# Patient Record
Sex: Female | Born: 1950 | Race: White | Hispanic: No | Smoking: Never smoker
Health system: Southern US, Community
[De-identification: ages and names within clinical notes are randomized; demographics above are authoritative.]

## PROBLEM LIST (undated history)

## (undated) DIAGNOSIS — M722 Plantar fascial fibromatosis: Secondary | ICD-10-CM

## (undated) DIAGNOSIS — M542 Cervicalgia: Secondary | ICD-10-CM

## (undated) DIAGNOSIS — E8801 Alpha-1-antitrypsin deficiency: Secondary | ICD-10-CM

## (undated) DIAGNOSIS — M26609 Unspecified temporomandibular joint disorder, unspecified side: Secondary | ICD-10-CM

## (undated) DIAGNOSIS — I10 Essential (primary) hypertension: Secondary | ICD-10-CM

## (undated) DIAGNOSIS — G8929 Other chronic pain: Secondary | ICD-10-CM

## (undated) DIAGNOSIS — R42 Dizziness and giddiness: Secondary | ICD-10-CM

## (undated) DIAGNOSIS — G4700 Insomnia, unspecified: Secondary | ICD-10-CM

## (undated) DIAGNOSIS — M1990 Unspecified osteoarthritis, unspecified site: Secondary | ICD-10-CM

## (undated) DIAGNOSIS — H35039 Hypertensive retinopathy, unspecified eye: Secondary | ICD-10-CM

## (undated) HISTORY — PX: KIDNEY STONE SURGERY: SHX686

## (undated) HISTORY — DX: Alpha-1-antitrypsin deficiency: E88.01

## (undated) HISTORY — DX: Other chronic pain: G89.29

## (undated) HISTORY — DX: Plantar fascial fibromatosis: M72.2

## (undated) HISTORY — DX: Insomnia, unspecified: G47.00

## (undated) HISTORY — PX: ABDOMINAL HYSTERECTOMY: SHX81

## (undated) HISTORY — PX: ADENOIDECTOMY: SUR15

## (undated) HISTORY — PX: TONSILLECTOMY: SUR1361

## (undated) HISTORY — DX: Cervicalgia: M54.2

## (undated) HISTORY — PX: OTHER SURGICAL HISTORY: SHX169

## (undated) HISTORY — DX: Unspecified osteoarthritis, unspecified site: M19.90

## (undated) HISTORY — DX: Hypertensive retinopathy, unspecified eye: H35.039

## (undated) HISTORY — PX: PARTIAL HYSTERECTOMY: SHX80

## (undated) HISTORY — DX: Essential (primary) hypertension: I10

## (undated) HISTORY — DX: Unspecified temporomandibular joint disorder, unspecified side: M26.609

## (undated) HISTORY — PX: BACK SURGERY: SHX140

---

## 1988-09-29 HISTORY — PX: ABDOMINAL HYSTERECTOMY: SHX81

## 1998-07-05 ENCOUNTER — Other Ambulatory Visit: Admission: RE | Admit: 1998-07-05 | Discharge: 1998-07-05 | Payer: Self-pay | Admitting: Obstetrics and Gynecology

## 2002-03-02 ENCOUNTER — Emergency Department (HOSPITAL_COMMUNITY): Admission: EM | Admit: 2002-03-02 | Discharge: 2002-03-02 | Payer: Self-pay | Admitting: Emergency Medicine

## 2002-03-02 ENCOUNTER — Encounter: Payer: Self-pay | Admitting: Emergency Medicine

## 2008-09-29 DIAGNOSIS — N200 Calculus of kidney: Secondary | ICD-10-CM

## 2008-09-29 HISTORY — DX: Calculus of kidney: N20.0

## 2009-06-20 ENCOUNTER — Emergency Department (HOSPITAL_COMMUNITY): Admission: EM | Admit: 2009-06-20 | Discharge: 2009-06-21 | Payer: Self-pay | Admitting: Emergency Medicine

## 2009-07-09 ENCOUNTER — Ambulatory Visit (HOSPITAL_BASED_OUTPATIENT_CLINIC_OR_DEPARTMENT_OTHER): Admission: RE | Admit: 2009-07-09 | Discharge: 2009-07-09 | Payer: Self-pay | Admitting: Urology

## 2009-08-03 ENCOUNTER — Encounter: Admission: RE | Admit: 2009-08-03 | Discharge: 2009-08-03 | Payer: Self-pay | Admitting: Endocrinology

## 2009-11-23 IMAGING — US US SOFT TISSUE HEAD/NECK
1 series · 14 of 25 positions shown · non-contrast
Comparison: None

CLINICAL DATA: Enlarged thyroid on physical exam, some hair loss,
normal thyroid laboratory values

THYROID ULTRASOUND
TECHNIQUE: Ultrasound examination of the thyroid gland and
adjacent soft tissues was performed.

[Series 1: us soft tissue head/neck · 0.07mm/px · 14 of 42 slices shown]
[im 1/42]
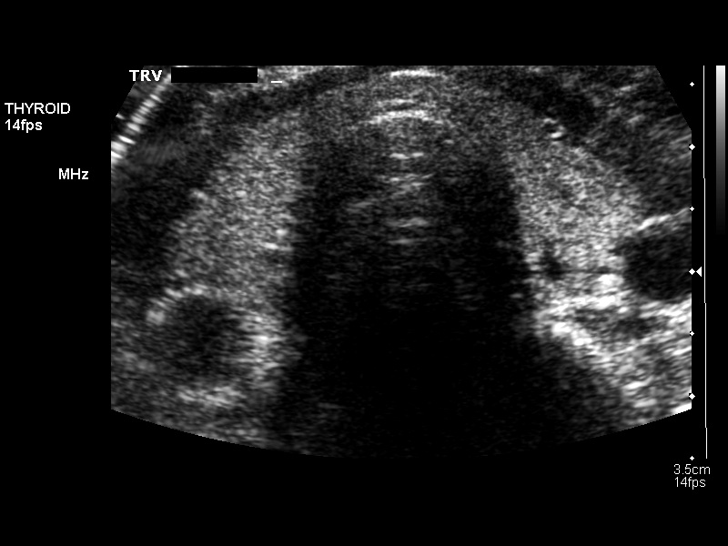
[im 4/42]
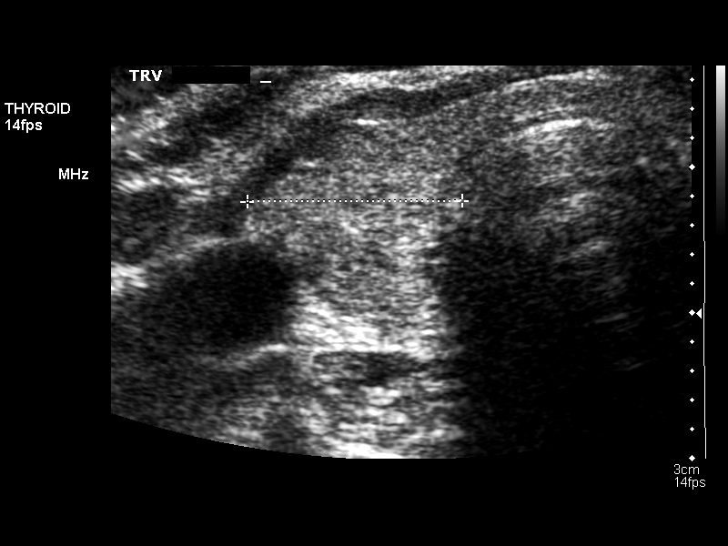
[im 7/42]
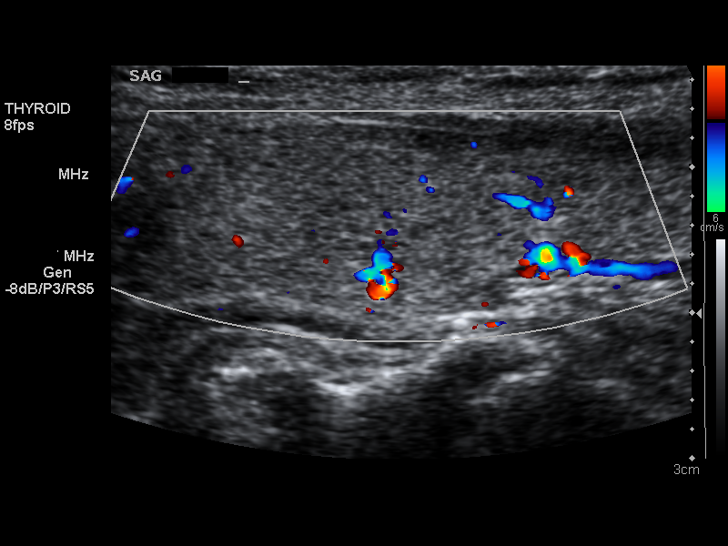
[im 11/42]
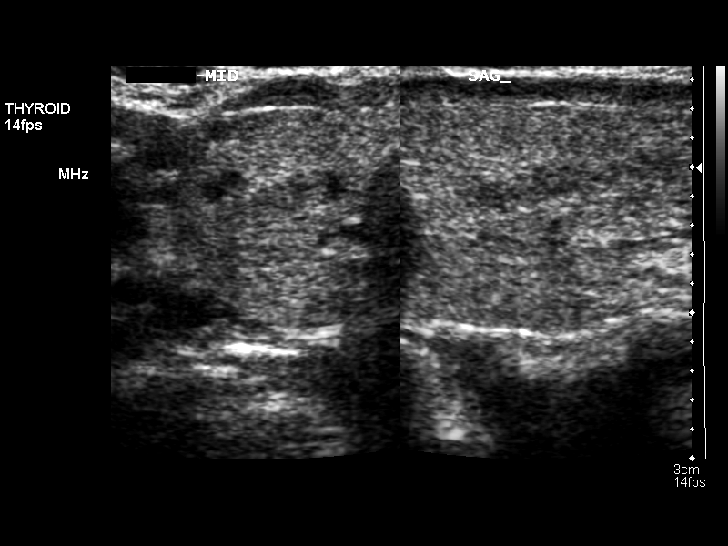
[im 14/42]
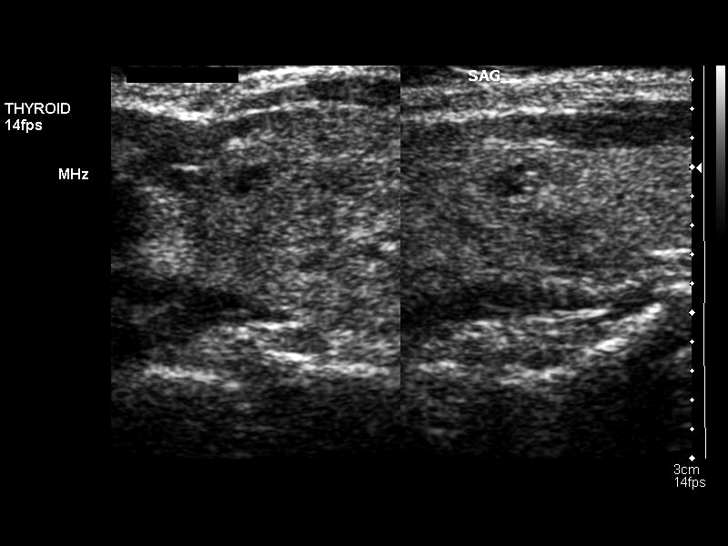
[im 16/42]
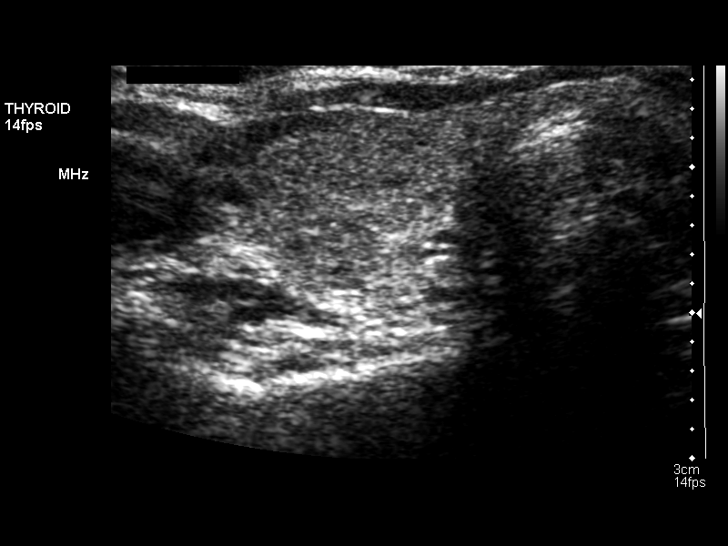
[im 19/42]
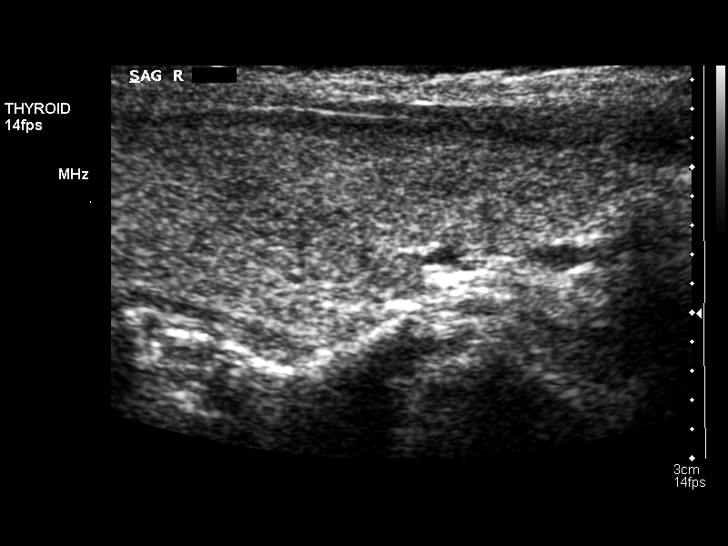
[im 23/42]
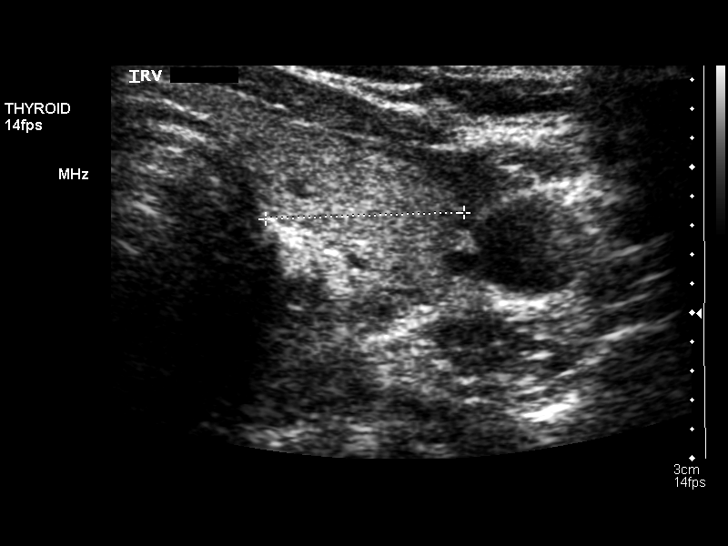
[im 26/42]
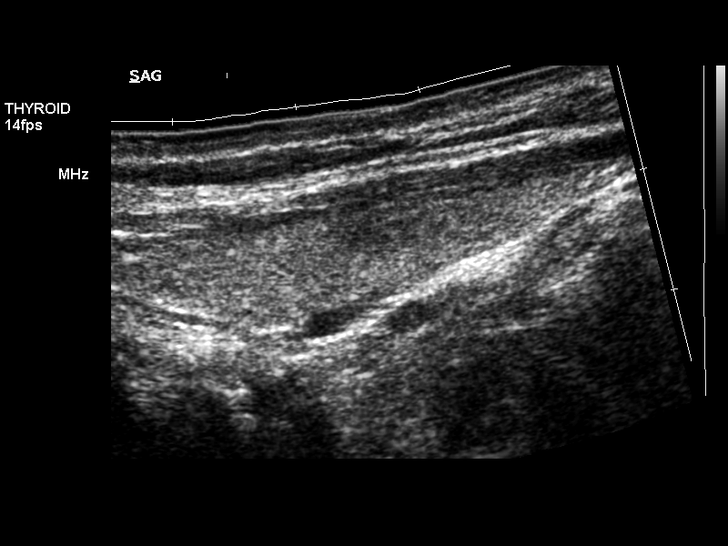
[im 28/42]
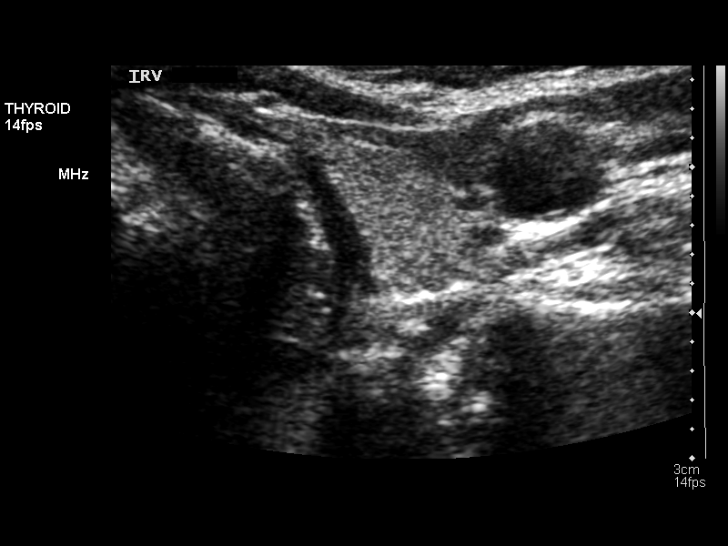
[im 31/42]
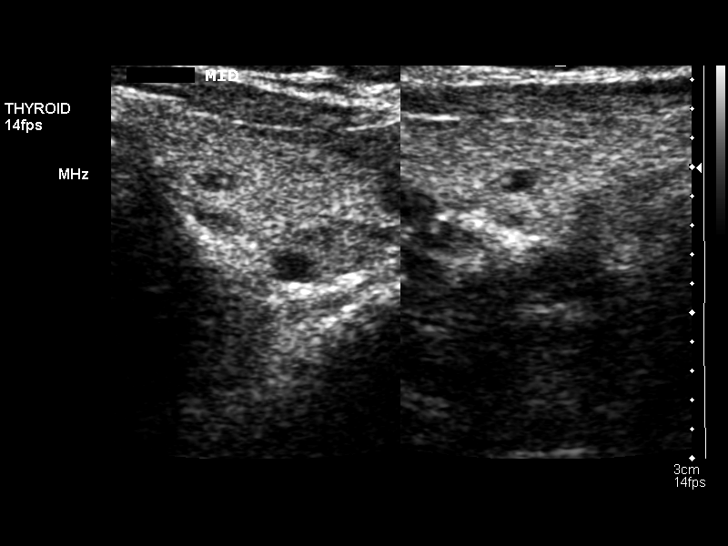
[im 35/42]
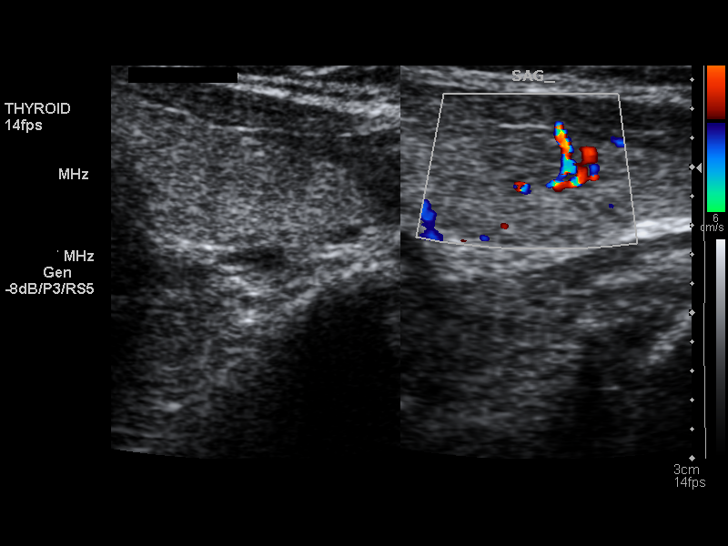
[im 38/42]
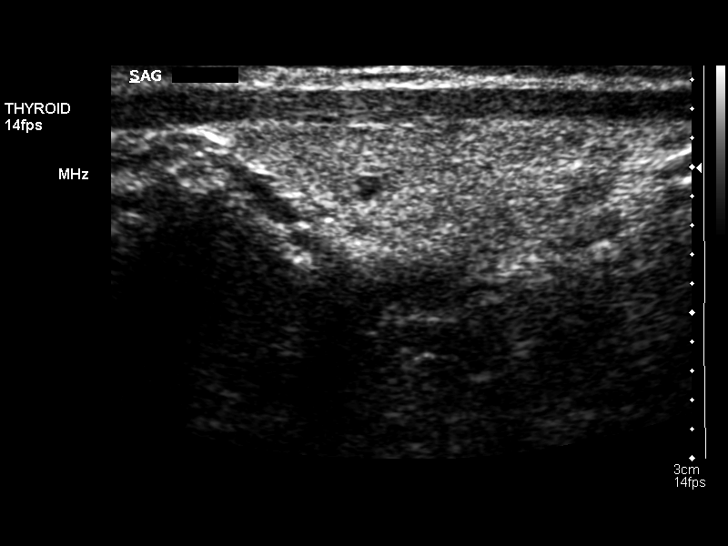
[im 42/42]
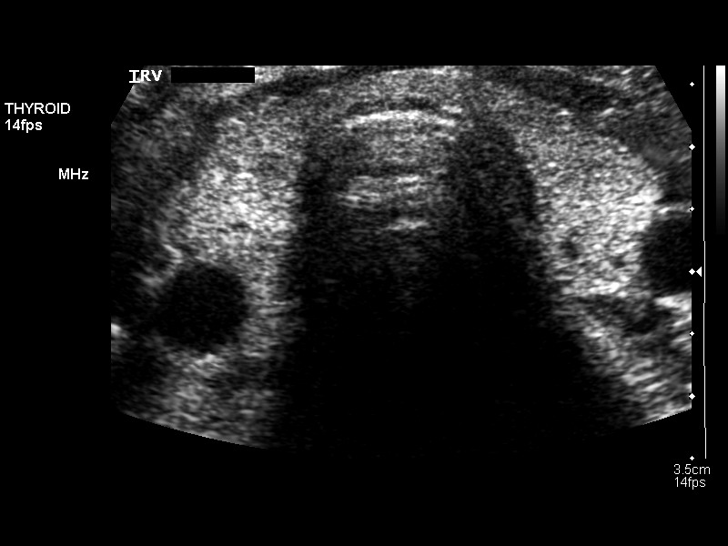

[14 of 25 positions shown; findings below may reference images not displayed]

FINDINGS: The thyroid gland is normal in size.  The right lobe
measures 4.3 x 0.9 x 1.5 cm.  The left lobe measures 4.0 x 1.0 x
1.4 cm, with the isthmus measuring 2.6 mm.  The gland is relatively
homogeneous.

There is a solid nodule in the right mid lobe which is somewhat
indistinct measuring 0.9 x 0.3 x 1.0 cm.  A hypoechoic nodule on
the right measures 4 x 2 x 5 mm.

On the left there is an indistinct solid nodule in the left mid
lobe of 0.8 x 0.5 x 0.8 cm with a hypoechoic nodule of 3 x 2 x 4 mm
in mid left lobe.
IMPRESSION: The thyroid gland is normal in size with only small nodules
bilaterally.

## 2010-01-30 ENCOUNTER — Encounter: Admission: RE | Admit: 2010-01-30 | Discharge: 2010-01-30 | Payer: Self-pay | Admitting: Endocrinology

## 2011-01-02 LAB — POCT HEMOGLOBIN-HEMACUE: Hemoglobin: 13.7 g/dL (ref 12.0–15.0)

## 2011-01-03 LAB — COMPREHENSIVE METABOLIC PANEL
ALT: 21 U/L (ref 0–35)
Alkaline Phosphatase: 75 U/L (ref 39–117)
CO2: 26 mEq/L (ref 19–32)
Chloride: 104 mEq/L (ref 96–112)
GFR calc non Af Amer: >60 mL/min (ref >60)
Glucose, Bld: 120 mg/dL — ABNORMAL HIGH (ref 70–99)
Potassium: 3.6 mEq/L (ref 3.5–5.1)
Sodium: 139 mEq/L (ref 135–145)
Total Bilirubin: 0.7 mg/dL (ref 0.3–1.2)

## 2011-01-03 LAB — URINE CULTURE: Colony Count: NO GROWTH

## 2011-01-03 LAB — URINALYSIS, ROUTINE W REFLEX MICROSCOPIC
Ketones, ur: NEGATIVE mg/dL
Nitrite: NEGATIVE
Specific Gravity, Urine: 1.029 (ref 1.005–1.030)
Urobilinogen, UA: 0.2 mg/dL (ref 0.0–1.0)
pH: 5 (ref 5.0–8.0)

## 2011-01-03 LAB — URINE MICROSCOPIC-ADD ON

## 2011-01-03 LAB — CBC: MCHC: 34 g/dL (ref 30.0–36.0)

## 2011-01-03 LAB — DIFFERENTIAL
Basophils Relative: 0 % (ref 0–1)
Eosinophils Absolute: 0 10*3/uL (ref 0.0–0.7)
Eosinophils Relative: 0 % (ref 0–5)
Neutrophils Relative %: 85 % — ABNORMAL HIGH (ref 43–77)

## 2011-02-24 ENCOUNTER — Emergency Department (HOSPITAL_COMMUNITY)
Admission: EM | Admit: 2011-02-24 | Discharge: 2011-02-24 | Disposition: A | Discharge status: Home / Self Care | Payer: BC Managed Care – PPO | Attending: Emergency Medicine | Admitting: Emergency Medicine

## 2011-02-24 DIAGNOSIS — M79609 Pain in unspecified limb: Secondary | ICD-10-CM | POA: Insufficient documentation

## 2011-02-24 DIAGNOSIS — N390 Urinary tract infection, site not specified: Secondary | ICD-10-CM | POA: Insufficient documentation

## 2011-02-24 DIAGNOSIS — R6883 Chills (without fever): Secondary | ICD-10-CM | POA: Insufficient documentation

## 2011-02-24 LAB — URINALYSIS, ROUTINE W REFLEX MICROSCOPIC
Nitrite: NEGATIVE
Protein, ur: NEGATIVE mg/dL
Specific Gravity, Urine: 1.011 (ref 1.005–1.030)
Urobilinogen, UA: 0.2 mg/dL (ref 0.0–1.0)

## 2011-02-24 LAB — CBC
Hemoglobin: 13.4 g/dL (ref 12.0–15.0)
MCHC: 33.8 g/dL (ref 30.0–36.0)
Platelets: 269 10*3/uL (ref 150–400)
RBC: 4.45 MIL/uL (ref 3.87–5.11)

## 2011-02-24 LAB — URINE MICROSCOPIC-ADD ON

## 2011-02-24 LAB — DIFFERENTIAL
Basophils Absolute: 0 10*3/uL (ref 0.0–0.1)
Basophils Relative: 0 % (ref 0–1)
Eosinophils Absolute: 0.1 10*3/uL (ref 0.0–0.7)
Neutro Abs: 5.6 10*3/uL (ref 1.7–7.7)
Neutrophils Relative %: 69 % (ref 43–77)

## 2011-02-24 LAB — BASIC METABOLIC PANEL
CO2: 27 mEq/L (ref 19–32)
Calcium: 9.7 mg/dL (ref 8.4–10.5)
GFR calc Af Amer: >60 mL/min (ref >60)
Sodium: 138 mEq/L (ref 135–145)

## 2011-02-25 LAB — URINE CULTURE
Colony Count: NO GROWTH
Culture  Setup Time: 201205271936
Culture: NO GROWTH

## 2011-04-30 ENCOUNTER — Encounter: Payer: Self-pay | Admitting: Podiatry

## 2011-04-30 DIAGNOSIS — M205X9 Other deformities of toe(s) (acquired), unspecified foot: Secondary | ICD-10-CM | POA: Insufficient documentation

## 2011-11-12 LAB — HM PAP SMEAR

## 2013-02-09 ENCOUNTER — Encounter: Payer: Self-pay | Admitting: *Deleted

## 2013-02-11 ENCOUNTER — Encounter: Payer: Self-pay | Admitting: Family Medicine

## 2013-02-11 ENCOUNTER — Ambulatory Visit (INDEPENDENT_AMBULATORY_CARE_PROVIDER_SITE_OTHER): Payer: BC Managed Care – PPO | Admitting: Family Medicine

## 2013-02-11 VITALS — BP 148/82 | HR 80 | Ht 63.0 in | Wt 154.4 lb

## 2013-02-11 DIAGNOSIS — Z79899 Other long term (current) drug therapy: Secondary | ICD-10-CM

## 2013-02-11 DIAGNOSIS — G4700 Insomnia, unspecified: Secondary | ICD-10-CM

## 2013-02-11 DIAGNOSIS — Z0000 Encounter for general adult medical examination without abnormal findings: Secondary | ICD-10-CM

## 2013-02-11 NOTE — Progress Notes (Signed)
  Subjective:    Patient ID: Brianna Carpenter, female    DOB: 15-Jul-1951, 62 y.o.   MRN: [id number]  HPI  Trouble sleeping at night. Usually requires the generic Ambien. On occasion patient feels the trade name Ambien does better. She likes to use both depending on how sleepy she is  diet very good. Exercising regularly. Very little fast food. Review of Systems ROS otherwise negative    Objective:   Physical Exam Alert no acute distress. Lungs clear. Heart regular rate and rhythm. HEENT normal. followup blood pressure normal 126/82       Assessment & Plan:  Impression insomnia discussed at length. Plan patient declines preventive physical, colonoscopy etc. Increase to blood work. WSL

## 2013-02-16 ENCOUNTER — Other Ambulatory Visit: Payer: Self-pay | Admitting: Nurse Practitioner

## 2013-02-16 ENCOUNTER — Telehealth: Payer: Self-pay | Admitting: Family Medicine

## 2013-02-16 NOTE — Telephone Encounter (Signed)
Patient would like to know if she needs to fast with her BW and if she can get an RX of the estrace cream to [facility] in [place]

## 2013-02-16 NOTE — Telephone Encounter (Signed)
Patient notified estrace refills was called in to [facility]. Also notified her she needs to fast for blood work.

## 2013-02-16 NOTE — Telephone Encounter (Signed)
Refill on estrace cream  

## 2013-02-16 NOTE — Telephone Encounter (Signed)
rf estrace times 4, fasting labs are choles and glucose depends on what was ordered

## 2013-02-24 LAB — HEPATIC FUNCTION PANEL
ALT: 17 U/L (ref 0–35)
AST: 15 U/L (ref 0–37)
Alkaline Phosphatase: 69 U/L (ref 39–117)
Bilirubin, Direct: 0.1 mg/dL (ref 0.0–0.3)
Indirect Bilirubin: 0.7 mg/dL (ref 0.0–0.9)
Total Protein: 6.5 g/dL (ref 6.0–8.3)

## 2013-02-24 LAB — BASIC METABOLIC PANEL
BUN: 19 mg/dL (ref 6–23)
Creat: 0.7 mg/dL (ref 0.50–1.10)
Glucose, Bld: 91 mg/dL (ref 70–99)
Potassium: 4.2 mEq/L (ref 3.5–5.3)

## 2013-02-24 LAB — LIPID PANEL
Cholesterol: 242 mg/dL — ABNORMAL HIGH (ref 0–200)
Triglycerides: 110 mg/dL (ref <150)

## 2013-08-26 ENCOUNTER — Ambulatory Visit: Payer: BC Managed Care – PPO | Admitting: Family Medicine

## 2013-08-26 VITALS — BP 135/82 | HR 85 | Temp 98.9°F | Resp 17 | Ht 63.5 in | Wt 153.0 lb

## 2013-08-26 DIAGNOSIS — R42 Dizziness and giddiness: Secondary | ICD-10-CM

## 2013-08-26 DIAGNOSIS — I10 Essential (primary) hypertension: Secondary | ICD-10-CM

## 2013-08-26 LAB — POCT CBC
HCT, POC: 43 % (ref 37.7–47.9)
Hemoglobin: 13.5 g/dL (ref 12.2–16.2)
Lymph, poc: 1.8 (ref 0.6–3.4)
MCH, POC: 30.2 pg (ref 27–31.2)
MCHC: 31.4 g/dL — AB (ref 31.8–35.4)
MPV: 9.6 fL (ref 0–99.8)
POC MID %: 4.2 %M (ref 0–12)
RBC: 4.47 M/uL (ref 4.04–5.48)
WBC: 5.7 10*3/uL (ref 4.6–10.2)

## 2013-08-26 LAB — COMPREHENSIVE METABOLIC PANEL
ALT: 14 U/L (ref 0–35)
AST: 17 U/L (ref 0–37)
Albumin: 4.6 g/dL (ref 3.5–5.2)
BUN: 13 mg/dL (ref 6–23)
CO2: 29 mEq/L (ref 19–32)
Calcium: 9.9 mg/dL (ref 8.4–10.5)
Chloride: 104 mEq/L (ref 96–112)
Potassium: 4.1 mEq/L (ref 3.5–5.3)

## 2013-08-26 LAB — TSH: TSH: 1.672 u[IU]/mL (ref 0.350–4.500)

## 2013-08-26 LAB — GLUCOSE, POCT (MANUAL RESULT ENTRY): POC Glucose: 87 mg/dl (ref 70–99)

## 2013-08-26 NOTE — Patient Instructions (Signed)
Minimize salt intake  Continue with regular exercise  Try and avoid any weight gain  Return if symptoms are worse  Continue monitor your blood pressure

## 2013-08-26 NOTE — Progress Notes (Signed)
Subjective: 62 year old lady with a history of her blood pressure creeping up on her more recently. Usually it is good, and she is comfortable with it being in the 130s or so. However she has had some readings recently that seemed to be going up more into the 140s or 150 region. She has noted a little tingly this at times. At nighttime when she is resting quietly she does wear earplugs, but recently has had more of a pounding sensation. Last night it seemed worse. She has a history of sleep disturbance for long time, and takes a tiny piece of anemia and at bedtime. She does get regular exercise about 3 days a week. She does not smoke. She is generally been a healthy person. She wonders about things like her blood sugar going low or her B12 being out of line  Objective: Pleasant lady in no major distress this time. I took the blood pressure again and got 154/84. Her TMs are normal. Eyes PERRLA. Neck supple without thyromegaly. Her chest is clear to auscultation. Heart regular without any murmurs gallops or arrhythmias. Neurologically she appears intact. Romberg is negative. No palmar drift.  Assessment: Systolic blood pressure elevation Nonspecific palpitations Mild dizziness Sleep disturbance  Plan: First check a few basic labs on her but I think she is quite healthy.  Results for orders placed in visit on 08/26/13  POCT CBC      Result Value Range   WBC 5.7  4.6 - 10.2 K/uL   Lymph, poc 1.8  0.6 - 3.4   POC LYMPH PERCENT 31.0  10 - 50 %L   MID (cbc) 0.2  0 - 0.9   POC MID % 4.2  0 - 12 %M   POC Granulocyte 3.7  2 - 6.9   Granulocyte percent 64.8  37 - 80 %G   RBC 4.47  4.04 - 5.48 M/uL   Hemoglobin 13.5  12.2 - 16.2 g/dL   HCT, POC 40.1  02.7 - 47.9 %   MCV 96.3  80 - 97 fL   MCH, POC 30.2  27 - 31.2 pg   MCHC 31.4 (*) 31.8 - 35.4 g/dL   RDW, POC 25.3     Platelet Count, POC 285  142 - 424 K/uL   MPV 9.6  0 - 99.8 fL  GLUCOSE, POCT (MANUAL RESULT ENTRY)      Result Value Range   POC Glucose 87  70 - 99 mg/dl

## 2013-10-14 ENCOUNTER — Telehealth: Payer: Self-pay | Admitting: Family Medicine

## 2013-10-14 NOTE — Telephone Encounter (Signed)
Pt notified she needs to come into office for blood pressure check. Last seen in May. Pt made an appt.

## 2013-10-14 NOTE — Telephone Encounter (Signed)
Patient says that the top number of her blood pressure has been staying around 140 for the past 6 months and patient would like to try dyazide.  [facility]

## 2013-10-18 ENCOUNTER — Ambulatory Visit (INDEPENDENT_AMBULATORY_CARE_PROVIDER_SITE_OTHER): Payer: BC Managed Care – PPO | Admitting: Family Medicine

## 2013-10-18 ENCOUNTER — Encounter: Payer: Self-pay | Admitting: Family Medicine

## 2013-10-18 VITALS — BP 138/90 | Ht 64.0 in | Wt 157.0 lb

## 2013-10-18 DIAGNOSIS — I10 Essential (primary) hypertension: Secondary | ICD-10-CM

## 2013-10-18 MED ORDER — ZOLPIDEM TARTRATE 10 MG PO TABS: 10.0000 mg | ORAL_TABLET | Freq: Every evening | ORAL | Status: DC | PRN

## 2013-10-18 MED ORDER — HYDROCHLOROTHIAZIDE 25 MG PO TABS: ORAL_TABLET | ORAL | Status: DC

## 2013-10-18 MED ORDER — HYDROCHLOROTHIAZIDE 25 MG PO TABS: 25.0000 mg | ORAL_TABLET | Freq: Every day | ORAL | Status: DC

## 2013-10-18 NOTE — Progress Notes (Signed)
   Subjective:    Patient ID: Brianna Carpenter, female    DOB: 12/10/1950, 63 y.o.   MRN: [id number]  HPIBlood pressure check up.   Strong fam hx of htn,  Sis takes diuretic and it helps her. Patient does not want to take strong blood pressure meds.  Some trouble sleeping at night, at times fairly severe. One symptom have on hand on a as needed basis.   Blood pressure has been elevated when checked elsewhere. Review of numbers your last several years showed continued elevated numbers.     Review of Systems No chest pain no headache no back pain no change in bowel habits no blood in stools no rash ROS otherwise negative    Objective:   Physical Exam Alert no apparent distress. Blood pressure 142/90 4 repeat. H&T normal. Lungs clear. Heart regular in rhythm.       Assessment & Plan:  Impression hypertension #2 insomnia discussed plan initiate one half hydrochlorothiazide tablet daily. Ambien when necessary. Increase potassium intake. Recheck in several months. Diet exercise discussed. WSL

## 2013-10-18 NOTE — Patient Instructions (Addendum)
Let's get some fasting blood work a wk before next visit--call then and we'll order  Hypertension As your heart beats, it forces blood through your arteries. This force is your blood pressure. If the pressure is too high, it is called hypertension (HTN) or high blood pressure. HTN is dangerous because you may have it and not know it. High blood pressure may mean that your heart has to work harder to pump blood. Your arteries may be narrow or stiff. The extra work puts you at risk for heart disease, stroke, and other problems.  Blood pressure consists of two numbers, a higher number over a lower, 110/72, for example. It is stated as "110 over 72." The ideal is below 120 for the top number (systolic) and under 80 for the bottom (diastolic). Write down your blood pressure today. You should pay close attention to your blood pressure if you have certain conditions such as:  Heart failure.  Prior heart attack.  Diabetes  Chronic kidney disease.  Prior stroke.  Multiple risk factors for heart disease. To see if you have HTN, your blood pressure should be measured while you are seated with your arm held at the level of the heart. It should be measured at least twice. A one-time elevated blood pressure reading (especially in the Emergency Department) does not mean that you need treatment. There may be conditions in which the blood pressure is different between your right and left arms. It is important to see your caregiver soon for a recheck. Most people have essential hypertension which means that there is not a specific cause. This type of high blood pressure may be lowered by changing lifestyle factors such as:  Stress.  Smoking.  Lack of exercise.  Excessive weight.  Drug/tobacco/alcohol use.  Eating less salt. Most people do not have symptoms from high blood pressure until it has caused damage to the body. Effective treatment can often prevent, delay or reduce that damage. TREATMENT   When a cause has been identified, treatment for high blood pressure is directed at the cause. There are a large number of medications to treat HTN. These fall into several categories, and your caregiver will help you select the medicines that are best for you. Medications may have side effects. You should review side effects with your caregiver. If your blood pressure stays high after you have made lifestyle changes or started on medicines,   Your medication(s) may need to be changed.  Other problems may need to be addressed.  Be certain you understand your prescriptions, and know how and when to take your medicine.  Be sure to follow up with your caregiver within the time frame advised (usually within two weeks) to have your blood pressure rechecked and to review your medications.  If you are taking more than one medicine to lower your blood pressure, make sure you know how and at what times they should be taken. Taking two medicines at the same time can result in blood pressure that is too low. SEEK IMMEDIATE MEDICAL CARE IF:  You develop a severe headache, blurred or changing vision, or confusion.  You have unusual weakness or numbness, or a faint feeling.  You have severe chest or abdominal pain, vomiting, or breathing problems. MAKE SURE YOU:   Understand these instructions.  Will watch your condition.  Will get help right away if you are not doing well or get worse. Document Released: 09/15/2005 Document Revised: 12/08/2011 Document Reviewed: 05/05/2008 [facility] Patient Information 2014 [place], [place].

## 2013-10-23 DIAGNOSIS — I10 Essential (primary) hypertension: Secondary | ICD-10-CM | POA: Insufficient documentation

## 2013-11-11 ENCOUNTER — Telehealth: Payer: Self-pay

## 2013-11-11 NOTE — Telephone Encounter (Signed)
Medication and allergies:  Reviewed and updated  90 day supply/mail order: n/a Local pharmacy:  [facility] on [address]   Immunizations due:  ALLERGY to [employer].  Influenza- declined; Zoster- declined   A/P: No changes to personal, family history or past surgical hx PAP- "a year or two ago" and does not plan on receiving another per patient; hx. hysterectomy CCS- Never had one MMG- Never had one BD- Never had one Flu- Declined Tdap- Declined Shingles- Declined   To Discuss with Provider: Not at this time.

## 2013-11-14 ENCOUNTER — Ambulatory Visit (INDEPENDENT_AMBULATORY_CARE_PROVIDER_SITE_OTHER): Payer: BC Managed Care – PPO | Admitting: Family Medicine

## 2013-11-14 ENCOUNTER — Encounter: Payer: Self-pay | Admitting: Family Medicine

## 2013-11-14 VITALS — BP 128/82 | HR 69 | Temp 98.6°F | Ht 63.5 in | Wt 153.4 lb

## 2013-11-14 DIAGNOSIS — G4700 Insomnia, unspecified: Secondary | ICD-10-CM

## 2013-11-14 DIAGNOSIS — I10 Essential (primary) hypertension: Secondary | ICD-10-CM

## 2013-11-14 MED ORDER — NONFORMULARY OR COMPOUNDED ITEM: Status: DC

## 2013-11-14 MED ORDER — HYDROCHLOROTHIAZIDE 12.5 MG PO CAPS: 12.5000 mg | ORAL_CAPSULE | Freq: Every day | ORAL | Status: DC

## 2013-11-14 MED ORDER — ZOLPIDEM TARTRATE 5 MG PO TABS: 5.0000 mg | ORAL_TABLET | Freq: Every evening | ORAL | Status: DC | PRN

## 2013-11-14 NOTE — Progress Notes (Signed)
Pre visit review using our clinic review tool, if applicable. No additional management support is needed unless otherwise documented below in the visit note. 

## 2013-11-14 NOTE — Patient Instructions (Signed)

## 2013-11-14 NOTE — Progress Notes (Signed)
Patient ID: Brianna Carpenter, female   DOB: 02-Aug-1951, 63 y.o.   MRN: [id number]   Subjective:    Patient ID: Brianna Carpenter, female    DOB: 02-Aug-1951, 63 y.o.   MRN: [id number] HPI Pt here to establish and discuss bp.  She only takes 1/4 25 mg hctz and hibiscuss.  She states she is not going to have mammograms or colonoscopy because even if she has cancer she is not going to be treated.  Pt states with her ins she has a very high deductible and can't afford them so if she is not going to be treated anyway why have them.   Past Medical History  Diagnosis Date  . Insomnia   . Plantar fasciitis   . TMJ (temporomandibular joint syndrome)   . Arthritis     Feet  . Chronic neck pain   . Hypertension   . Hypertensive retinopathy    History   Social History  . Marital Status: Divorced    Spouse Name: N/A    Number of Children: N/A  . Years of Education: N/A   Occupational History  . 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 Smoking status: Never Smoker   . Smokeless tobacco: Not on file  . Alcohol Use: 3.5 oz/week    7 drink(s) per week  . Drug Use: No  . Sexual Activity: No   Other Topics Concern  . Not on file   Social History Narrative   3x a week---  Elliptical and treadmill   Family History  Problem Relation Age of Onset  . Heart disease Father    Current Outpatient Prescriptions on File Prior to Visit  Medication Sig Dispense Refill  . ALOE VERA PO Take 250 mg by mouth daily.      Marland Kitchen. aspirin EC 81 MG tablet Take 81 mg by mouth daily.      . Cholecalciferol (VITAMIN D3) 2000 UNITS TABS Take 1 tablet by mouth daily.      . NON FORMULARY Take 1,070 mg by mouth daily. Hibiscus Flower Extract      . Nutritional Supplements (DHEA PO) Take 5 mg by mouth daily.       No current facility-administered medications on file prior to visit.         Objective:    BP 128/82  Pulse 69  Temp(Src) 98.6 F (37 C) (Oral)  Ht  5' 3.5" (1.613 m)  Wt 153 lb 6.4 oz (69.582 kg)  BMI 26.74 kg/m2  SpO2 97% General appearance: alert, cooperative, appears stated age and no distress Throat: lips, mucosa, and tongue normal; teeth and gums normal Neck: no adenopathy, supple, symmetrical, trachea midline and thyroid not enlarged, symmetric, no tenderness/mass/nodules Lungs: clear to auscultation bilaterally Heart: regular rate and rhythm, S1, S2 normal, no murmur, click, rub or gallop Extremities: extremities normal, atraumatic, no cyanosis or edema       Assessment & Plan:  1. HTN (hypertension) Only taking 6.25 mg hctz with hibiscus - NONFORMULARY OR COMPOUNDED ITEM; Hibiscus capsule 1 po qd - hydrochlorothiazide (MICROZIDE) 12.5 MG capsule; Take 1 capsule (12.5 mg total) by mouth daily.  Dispense: 30 capsule; Refill: 5  2. Insomnia Very rare and very low dose-- she takes  Nibble out of 10 mg pill--- 1 pill will last 2-3 nights

## 2013-12-26 ENCOUNTER — Encounter: Payer: Self-pay | Admitting: Family Medicine

## 2013-12-26 DIAGNOSIS — R5381 Other malaise: Secondary | ICD-10-CM

## 2013-12-26 DIAGNOSIS — Z0189 Encounter for other specified special examinations: Secondary | ICD-10-CM

## 2013-12-26 DIAGNOSIS — I10 Essential (primary) hypertension: Secondary | ICD-10-CM

## 2013-12-26 DIAGNOSIS — R5383 Other fatigue: Secondary | ICD-10-CM

## 2014-01-17 ENCOUNTER — Other Ambulatory Visit (INDEPENDENT_AMBULATORY_CARE_PROVIDER_SITE_OTHER): Payer: BC Managed Care – PPO

## 2014-01-17 DIAGNOSIS — I10 Essential (primary) hypertension: Secondary | ICD-10-CM

## 2014-01-17 LAB — BASIC METABOLIC PANEL
BUN: 18 mg/dL (ref 6–23)
CHLORIDE: 102 meq/L (ref 96–112)
CO2: 29 meq/L (ref 19–32)
Calcium: 9.4 mg/dL (ref 8.4–10.5)
Creatinine, Ser: 0.6 mg/dL (ref 0.4–1.2)
GFR: 106.83 mL/min (ref >60.00)
Glucose, Bld: 76 mg/dL (ref 70–99)
POTASSIUM: 3.5 meq/L (ref 3.5–5.1)
SODIUM: 140 meq/L (ref 135–145)

## 2014-01-23 ENCOUNTER — Other Ambulatory Visit: Payer: Self-pay | Admitting: Family Medicine

## 2014-01-24 NOTE — Telephone Encounter (Signed)
Saw a dr Laury Axon in mid feb and given thirty plus two ref, deos she really need refills now (thought she didn't take whole tab)

## 2014-01-25 NOTE — Telephone Encounter (Signed)
Patient did not realize that doctor sent in refills for her. She is good on her medication. She does not need any more. She wont be seeing that doctor anymore either.

## 2014-01-30 ENCOUNTER — Telehealth: Payer: Self-pay | Admitting: Family Medicine

## 2014-01-30 ENCOUNTER — Other Ambulatory Visit: Payer: Self-pay | Admitting: *Deleted

## 2014-01-30 DIAGNOSIS — I10 Essential (primary) hypertension: Secondary | ICD-10-CM

## 2014-01-30 MED ORDER — HYDROCHLOROTHIAZIDE 12.5 MG PO CAPS: 12.5000 mg | ORAL_CAPSULE | Freq: Every day | ORAL | Status: DC

## 2014-01-30 MED ORDER — ZOLPIDEM TARTRATE 5 MG PO TABS: 5.0000 mg | ORAL_TABLET | Freq: Every evening | ORAL | Status: DC | PRN

## 2014-01-30 MED ORDER — ZOLPIDEM TARTRATE 10 MG PO TABS: 10.0000 mg | ORAL_TABLET | Freq: Every evening | ORAL | Status: DC | PRN

## 2014-01-30 NOTE — Telephone Encounter (Signed)
meds sent to pharm. Pt notified on voicemail.  

## 2014-01-30 NOTE — Telephone Encounter (Signed)
Last seen 09/2013 

## 2014-01-30 NOTE — Telephone Encounter (Signed)
Left message on voicemail to return call.

## 2014-01-30 NOTE — Telephone Encounter (Signed)
Patient needs Rx for zolpidem 10 mg

## 2014-01-30 NOTE — Telephone Encounter (Signed)
Ok 90 d total worth of both

## 2014-01-30 NOTE — Telephone Encounter (Signed)
plz see my prior response to a prior phone call within last couple wks, pt went on and saw another prim care provider in [place], who also rx'ed the [place]. Question was, who is now rx'ing, I never saw an answer.

## 2014-01-30 NOTE — Telephone Encounter (Signed)
Pt seen here 10/18/13 for blood pressure check up. Seen 11/14/13 by Dr. Laury AxonLowne and pt states they did prescribe the ambien but she didn't get filled. Stated she was not going to go back to Dr. Laury AxonLowne. Would like for you to refill ambien. And also hctz 12.5mg  one a day.

## 2014-02-02 NOTE — Telephone Encounter (Signed)
Refill x4 please 

## 2014-02-27 ENCOUNTER — Ambulatory Visit: Payer: BC Managed Care – PPO | Admitting: Internal Medicine

## 2014-04-10 ENCOUNTER — Ambulatory Visit: Payer: BC Managed Care – PPO | Admitting: Internal Medicine

## 2014-04-24 ENCOUNTER — Ambulatory Visit: Payer: BC Managed Care – PPO | Admitting: Internal Medicine

## 2014-05-05 LAB — BASIC METABOLIC PANEL
BUN: 13 mg/dL (ref 6–23)
CALCIUM: 9.5 mg/dL (ref 8.4–10.5)
CO2: 28 meq/L (ref 19–32)
Chloride: 102 mEq/L (ref 96–112)
Creat: 0.72 mg/dL (ref 0.50–1.10)
GLUCOSE: 87 mg/dL (ref 70–99)
POTASSIUM: 4.3 meq/L (ref 3.5–5.3)
Sodium: 140 mEq/L (ref 135–145)

## 2014-05-05 LAB — LIPID PANEL
Cholesterol: 239 mg/dL — ABNORMAL HIGH (ref 0–200)
HDL: 88 mg/dL (ref >39)
LDL Cholesterol: 127 mg/dL — ABNORMAL HIGH (ref 0–99)
Total CHOL/HDL Ratio: 2.7 Ratio
Triglycerides: 122 mg/dL (ref <150)
VLDL: 24 mg/dL (ref 0–40)

## 2014-05-06 LAB — VITAMIN D 25 HYDROXY (VIT D DEFICIENCY, FRACTURES): Vit D, 25-Hydroxy: 44 ng/mL (ref 30–89)

## 2014-05-29 ENCOUNTER — Encounter: Payer: Self-pay | Admitting: Family Medicine

## 2014-05-31 ENCOUNTER — Ambulatory Visit: Payer: BC Managed Care – PPO | Admitting: Family Medicine

## 2014-06-02 ENCOUNTER — Ambulatory Visit: Payer: BC Managed Care – PPO | Admitting: Family Medicine

## 2014-06-06 ENCOUNTER — Other Ambulatory Visit: Payer: Self-pay | Admitting: Family Medicine

## 2014-06-13 ENCOUNTER — Telehealth: Payer: Self-pay | Admitting: Family Medicine

## 2014-06-13 NOTE — Telephone Encounter (Signed)
Nurse spk with pt i have no idea what this message means

## 2014-06-13 NOTE — Telephone Encounter (Signed)
Patient has appt. With Dr Brett Canales 9/22 to discuss nerve pain s/p having cyst removed by Dr Henriette Combs- Dr Lorin Picket spoke with  Dr. Ninetta Lights last night about patient. Patient states she is fine with her current appt. 06/20/14

## 2014-06-13 NOTE — Telephone Encounter (Signed)
ok 

## 2014-06-13 NOTE — Telephone Encounter (Signed)
Dermatology Dr Henriette Combs states that he spoke to you personally last night after hours And you said for this patient to make an appt at our office but did not state when she needed To come in. She already has an appt with our office for next week, is that appt sufficient or does  She need one for this week?

## 2014-06-13 NOTE — Telephone Encounter (Signed)
[facility] called to say you requested this patient to have an appt with our Office. Nothing was at my desk this am to say put her on this week sometime. She is already scheduled For the 22nd (next Tues). Do you wish for her to come before then?   Please advise

## 2014-06-20 ENCOUNTER — Ambulatory Visit (INDEPENDENT_AMBULATORY_CARE_PROVIDER_SITE_OTHER): Payer: BC Managed Care – PPO | Admitting: Family Medicine

## 2014-06-20 VITALS — BP 140/84 | Ht 63.0 in | Wt 152.0 lb

## 2014-06-20 DIAGNOSIS — I10 Essential (primary) hypertension: Secondary | ICD-10-CM

## 2014-06-20 DIAGNOSIS — G4700 Insomnia, unspecified: Secondary | ICD-10-CM

## 2014-06-20 MED ORDER — ZOLPIDEM TARTRATE 10 MG PO TABS: 10.0000 mg | ORAL_TABLET | Freq: Every evening | ORAL | Status: DC | PRN

## 2014-06-20 MED ORDER — HYDROCHLOROTHIAZIDE 12.5 MG PO CAPS: ORAL_CAPSULE | ORAL | Status: DC

## 2014-06-20 NOTE — Progress Notes (Signed)
   Subjective:    Patient ID: Brianna Carpenter, female    DOB: [birth year], 63 y.o.   MRN: [id number]  HPI Patient here today for blood pressure check.   BPs good at home, taking tyl 500 mg, and this leads to elevation of numbers.  Had a new relationship and developed an infected hair follicle  Tried heat and local therapy  Went to see a derm and saw another PA  Felt that biopsy was warranted,  Received it, then developed constant pain  No energy and wiped out  Biopsy result every thing fine  Healed nicely, pain excrutiating  No abx given then  Wonders if they "clipped a nerve" or something else  Followed up with pa once,   Patients has concerns about minor surgery in groin area & every since then she has had chronic pain.Patient would like to discuss what can be done.  Patient claims compliance with blood pressure medication. No obvious side effects. Numbers generally running well.  Had a potassium level done. In followup this was good.  Ongoing challenges with sleeping uses when necessary Ambien Review of Systems No headache no chest pain no back pain no abdominal pain no change in bowel habits    Objective:   Physical Exam Of note when I advised the patient I would listen to her heart breathing and blood pressure and then check her area of concern on her left leg with the presence of a Carpenter chaperone, she stated she had to be had another appointment soon and did not have time for all of this. Both the patient and I conferred and we agreed to make this just a regular checkup for blood pressure and sleep.  Alert vitals stable. Blood pressure 124/80 on repeat. Lungs clear. Heart regular in rhythm.       Assessment & Plan:  Impression 1 hypertension good control. #2 insomnia ongoing decent control with partial Ambien use plan blood pressure medicine refilled for 6 months. Ambien refill. Diet exercise discussed. Patient encouraged to reschedule another visit to further  developed into her additional concerns that she did not have time for today. WSL

## 2014-06-20 NOTE — Patient Instructions (Signed)
We will be happy to follow you sooner for additional concerns anytime you can schedule an appointment

## 2014-06-21 ENCOUNTER — Other Ambulatory Visit: Payer: Self-pay | Admitting: Family Medicine

## 2014-06-21 DIAGNOSIS — R1032 Left lower quadrant pain: Secondary | ICD-10-CM

## 2014-06-27 ENCOUNTER — Other Ambulatory Visit: Payer: BC Managed Care – PPO

## 2014-06-28 ENCOUNTER — Ambulatory Visit (INDEPENDENT_AMBULATORY_CARE_PROVIDER_SITE_OTHER): Payer: BC Managed Care – PPO | Admitting: Family Medicine

## 2014-06-28 ENCOUNTER — Encounter: Payer: Self-pay | Admitting: Family Medicine

## 2014-06-28 VITALS — BP 128/86 | Ht 63.0 in | Wt 149.0 lb

## 2014-06-28 DIAGNOSIS — M792 Neuralgia and neuritis, unspecified: Secondary | ICD-10-CM

## 2014-06-28 DIAGNOSIS — G5790 Unspecified mononeuropathy of unspecified lower limb: Secondary | ICD-10-CM

## 2014-06-28 MED ORDER — NORTRIPTYLINE HCL 10 MG PO CAPS: 10.0000 mg | ORAL_CAPSULE | Freq: Every day | ORAL | Status: DC

## 2014-06-28 MED ORDER — HYDROCODONE-ACETAMINOPHEN 5-325 MG PO TABS: 1.0000 | ORAL_TABLET | Freq: Four times a day (QID) | ORAL | Status: DC | PRN

## 2014-06-28 NOTE — Patient Instructions (Signed)
nortryptiline useful for chronic and subacute neuropathic pain  neurontin and  lyrica can also be helpful

## 2014-06-28 NOTE — Progress Notes (Signed)
   Subjective:    Patient ID: Brianna Carpenter, female    DOB: 11-24-1950, 63 y.o.   MRN: [id number]  HPIGroin pain, Back pain and left leg pain after having biopsy in groin area. Went to urgent care on 9/23 because of severe pain. CT pelvis without contrast was ordered. Pt brought in copy of  Scan.  abd pain worsened ,  Went to [facility] near her house  ck'ed lymph node was concerned about a hernia  [facility]  Scan came back normal  Developing sweats  Started cking temps on a reg basis  Shaking and chills  Went back to [place]  ck'ed blood work,  Highest temp at home, [employer] and hot flash  Used small amnt of estrace, hx of prior exposure to steroid         Review of Systems No vomiting no fever no chills no chest pain no back pain    Objective:   Physical Exam  Alert mild malaise no acute distress vital stable. Lungs clear. Heart rare rhythm. Abdominal exam benign. Left groin biopsy site well medial to major nerve and artery. Diffuse hypersensitivity in this region. Also worse with rotation of the in the same region      Assessment & Plan:  Impression post skin biopsy neuropathic hypersensitivity. Somewhat unusual presentation. Discussed with patient. She has had a rather full workup with negative abdominal and pelvis CT scan along with negative blood work. I think she's had a protracted hypersensitive response at the site of the biopsy with no evidence of persistent infection. Has also clearly affected patient's mood. Plan 25 minutes spent most in discussion. Initiate low dose of nortriptyline. Patient advised that this hypersensitivity will eventually diminished. In main times regular exercise encourage. Followup as scheduled. WSL

## 2014-07-01 DIAGNOSIS — M792 Neuralgia and neuritis, unspecified: Secondary | ICD-10-CM | POA: Insufficient documentation

## 2014-07-10 ENCOUNTER — Encounter: Payer: Self-pay | Admitting: Family Medicine

## 2014-07-11 ENCOUNTER — Encounter: Payer: Self-pay | Admitting: Family Medicine

## 2014-07-25 ENCOUNTER — Telehealth: Payer: Self-pay | Admitting: Family Medicine

## 2014-07-25 NOTE — Telephone Encounter (Signed)
Call pt tomorrow, let her know i spoke with dr Mairlyn Filbert.\ the dermztlogist.  Dr Adley Filbert advised me that pt said in letter to her nortryptiline was not helping. i rec to pt that she comes in next wk for a f u with me to disc further options for neuropathic discomfort.

## 2014-07-25 NOTE — Telephone Encounter (Signed)
Dr Elmon Else of Dermatology is calling to see if you would mind giving her a  Call regarding when [name] came in for her post biopsy appt with you on 06/28/14  About her groin pain.    Call her cell at [phone] or office at [phone]

## 2014-07-26 NOTE — Telephone Encounter (Signed)
Discussed with pt. She states she is managing the pain with out the medication. She is using a compression bandage and states the pain is much better. She states she will call back if pain gets worse for a follow up appt.

## 2014-08-08 ENCOUNTER — Ambulatory Visit (INDEPENDENT_AMBULATORY_CARE_PROVIDER_SITE_OTHER): Payer: Medicare Other | Admitting: Family Medicine

## 2014-08-08 ENCOUNTER — Encounter: Payer: Self-pay | Admitting: Family Medicine

## 2014-08-08 VITALS — BP 130/72 | Ht 63.0 in | Wt 155.0 lb

## 2014-08-08 DIAGNOSIS — M792 Neuralgia and neuritis, unspecified: Secondary | ICD-10-CM

## 2014-08-08 DIAGNOSIS — G5792 Unspecified mononeuropathy of left lower limb: Secondary | ICD-10-CM

## 2014-08-08 NOTE — Progress Notes (Signed)
   Subjective:    Patient ID: Brianna Carpenter, female    DOB: [birth year], 63 y.o.   MRN: [id number]  HPI  Patient arrives for a follow up on left thigh pain. Patient states it is a little better but very slow to resolve.  nortryp 10 mg definitely helping discomfort but unfortunately leads to a bit of grogginess the following day. Next  Patient has signed up with a personal trainer has not gone yet. He hopes to start exercising again.  No weakness. Just discomfort and burning in left anterior thigh region.     Review of Systems No headache no chest pain no back pain    Objective:   Physical Exam  Alert vital signs stable blood pressure excellent. Region not examined again.      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 I actually spoke with the dermatologist since last visit. She is in hopes the patient will gradually improve. I also feel this has very good chance of resulting eventually I just do not know when. Discussed. Plan nortriptyline on a when necessary basis. Encouraged to get back with regular exercise. WSL

## 2014-08-22 ENCOUNTER — Encounter: Payer: Self-pay | Admitting: Sports Medicine

## 2014-08-22 ENCOUNTER — Ambulatory Visit (INDEPENDENT_AMBULATORY_CARE_PROVIDER_SITE_OTHER): Payer: Medicare Other | Admitting: Sports Medicine

## 2014-08-22 DIAGNOSIS — M792 Neuralgia and neuritis, unspecified: Secondary | ICD-10-CM

## 2014-08-22 DIAGNOSIS — G5792 Unspecified mononeuropathy of left lower limb: Secondary | ICD-10-CM

## 2014-08-22 DIAGNOSIS — G5712 Meralgia paresthetica, left lower limb: Secondary | ICD-10-CM

## 2014-08-22 NOTE — Patient Instructions (Addendum)
Hip Exercises:

## 2014-08-22 NOTE — Progress Notes (Signed)
  Brianna Carpenter - 63 y.o. female MRN [id number]  Date of birth: 27-Apr-1951  SUBJECTIVE:  Including CC & ROS.  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 She present today to discuss evaluation and treatment of her chronic groin pain. Patient reports a history of 4 months ago she was seen by dermatology for a ingrown hair and skin lesion, the lesion was localized in her right inguinal region and was removed with punch biopsy. Following this procedure patient has develop significant neuropathic pain and sensitivity in this area which was worse the first month but has improved some. She was evaluated by an urgent with CT ABD/Pelvis that r/o any intraabdominal or pelvic pathology. Patient was started on nortriptyline for neuropathic pain control by PCP, which has helped and was using it daily but now only PRN because of sedation and confusion side effects. Describes the pain as a burning and painful mostly sharp, no numbness no tingling. Sometimes pain radiates down anterior thigh and around to low back as a dull ache no radiculopathy pain down leg.   ROS: Review of systems otherwise negative except for information present in HPI  HISTORY: Past Medical, Surgical, Social, and Family History Reviewed & Updated per EMR. Pertinent Historical Findings include: HTN, lumbar pain and surgery 1987 no a current problem, insomnia  DATA REVIEWED: Pelvic/ABD CT report review in patient's record no images available  PHYSICAL EXAM:  VS: BP:135/83 mmHg  HR:85bpm  TEMP: ( )  RESP:   HT:5\' 3"  (160 cm)   WT:155 lb (70.308 kg)  BMI:27.5 HIP EXAM:  General: well nourished Skin of LE: warm; dry, no rashes, lesions, ecchymosis or erythema. Vascular: Dorsal pedal and femoral pulses 2+ bilaterally Neurologically: Sensation to light touch lower extremities equal and intact bilaterally.    Observation - no ecchymosis, edema, or hematoma present  over the anterior, lateral, or posterior soft tissue surrounding the hip Palpation:  No anterior hip joint tenderness No tenderness over the pubic synthesis,  No tenderness of the the ASIS, no pain over the iliac crest,  No tenderness of the lateral greater trochanter or bursa, No PSIS tenderness or SI joint pain ROM: Normal Hip motion in flexion, extension, internal and external rotation Muscle strength: No pain or weakness with iliopsoas flexion, rectus femoral flexion, hamstring and gluteal extension, hip adduction or abduction.  No pain or weakness with gluteus medius and minimus hip abduction, abdominal muscle contraction forward or oblique positions. Strength for muscle testing in all plane was 5/5.   MSK [place]: No hypoechoic changes around the superficial femoral nerve possible some scar tissue surrounding the nerve bundle.   ASSESSMENT & PLAN: See problem based charting & AVS for pt instructions.   We spent greater than 50% of our 30 minute office visit in counseling education regarding the patient current clinical problem, risks and benefits of treatment options, and recommend plans for treatment or further evaluation

## 2014-08-23 NOTE — Assessment & Plan Note (Addendum)
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  Recommendation for treatment: Continue PRN amitriptyline 10mg  and hydrocodone PRN for pain Start capzasin topical TID to QID for pain control Provide HEP with ROM and hip/ pelvic strengthening exercises F/U in 4 week to reassess

## 2014-08-29 ENCOUNTER — Other Ambulatory Visit: Payer: Self-pay | Admitting: Family Medicine

## 2014-10-10 ENCOUNTER — Ambulatory Visit: Payer: Medicare Other | Admitting: Sports Medicine

## 2014-11-07 ENCOUNTER — Ambulatory Visit (INDEPENDENT_AMBULATORY_CARE_PROVIDER_SITE_OTHER): Payer: Medicare Other | Admitting: Sports Medicine

## 2014-11-07 ENCOUNTER — Encounter: Payer: Self-pay | Admitting: Sports Medicine

## 2014-11-07 VITALS — BP 138/80 | Ht 63.0 in | Wt 155.0 lb

## 2014-11-07 DIAGNOSIS — G5712 Meralgia paresthetica, left lower limb: Secondary | ICD-10-CM

## 2014-11-07 DIAGNOSIS — M792 Neuralgia and neuritis, unspecified: Secondary | ICD-10-CM

## 2014-11-07 DIAGNOSIS — G5792 Unspecified mononeuropathy of left lower limb: Secondary | ICD-10-CM

## 2014-11-07 NOTE — Progress Notes (Signed)
  Brianna Carpenter - 64 y.o. female MRN [id number]  Date of birth: 1950/10/23  SUBJECTIVE:  Including CC & ROS.  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 Recently she has been able to stop using amitriptyline for neuropathic pain for the past three weeks. She has been able to work the strengthening exercise at home and started going to the gym again and able to walk 2 miles with no pain. Today she localizes her pain in the groin radiates posterior into the buttock and lateral thigh.  She continues to describes the pain as a burning and painful mostly sharp, no numbness no tingling. Sometimes pain radiates down anterior thigh and around to low back as a dull ache no radiculopathy pain down leg.   ROS: Review of systems otherwise negative except for information present in HPI  HISTORY: Past Medical, Surgical, Social, and Family History Reviewed & Updated per EMR. Pertinent Historical Findings include: HTN, lumbar pain and surgery 1987 no a current problem, insomnia  DATA REVIEWED: Pelvic/ABD CT report review in patient's record no images available  PHYSICAL EXAM:  VS: BP:138/80 mmHg  HR: bpm  TEMP: ( )  RESP:   HT:5\' 3"  (160 cm)   WT:155 lb (70.308 kg)  BMI:27.5 HIP EXAM:  General: well nourished Skin of LE: warm; dry, no rashes, lesions, ecchymosis or erythema. Vascular: Dorsal pedal and femoral pulses 2+ bilaterally Neurologically: Sensation to light touch lower extremities equal and intact bilaterally.    Observation - no ecchymosis, edema, or hematoma present over the anterior, lateral, or posterior soft tissue surrounding the hip Palpation:  Continue TTP over the left groin  No tenderness of the lateral greater trochanter No PSIS tenderness or SI joint pain ROM: Normal Hip motion in flexion, extension, internal and external rotation Muscle strength: Pain and mild weakness with iliopsoas flexion, rectus femoral flexion,  hamstring and gluteal extension, hip adduction or abduction.  Pain and weakness with gluteus medius and minimus hip abduction, abdominal muscle contraction forward or oblique positions. Strength for muscle testing in all plane was 5/5.   ASSESSMENT & PLAN: See problem based charting & AVS for pt instructions.

## 2014-11-07 NOTE — Assessment & Plan Note (Signed)
Patient is clinically improving with strengthening and staying active. Recommend continuing to go to the gym. Recommended using amitriptyline prn. F/U as needed.

## 2015-01-17 ENCOUNTER — Encounter: Payer: Self-pay | Admitting: Family Medicine

## 2015-02-05 ENCOUNTER — Encounter: Payer: Self-pay | Admitting: Family Medicine

## 2015-02-05 ENCOUNTER — Ambulatory Visit (INDEPENDENT_AMBULATORY_CARE_PROVIDER_SITE_OTHER): Payer: Medicare Other | Admitting: Family Medicine

## 2015-02-05 VITALS — BP 130/86 | Ht 63.0 in | Wt 156.0 lb

## 2015-02-05 DIAGNOSIS — T887XXA Unspecified adverse effect of drug or medicament, initial encounter: Secondary | ICD-10-CM | POA: Diagnosis not present

## 2015-02-05 DIAGNOSIS — Z139 Encounter for screening, unspecified: Secondary | ICD-10-CM

## 2015-02-05 DIAGNOSIS — G4700 Insomnia, unspecified: Secondary | ICD-10-CM

## 2015-02-05 DIAGNOSIS — G5792 Unspecified mononeuropathy of left lower limb: Secondary | ICD-10-CM | POA: Diagnosis not present

## 2015-02-05 DIAGNOSIS — G5712 Meralgia paresthetica, left lower limb: Secondary | ICD-10-CM

## 2015-02-05 DIAGNOSIS — I10 Essential (primary) hypertension: Secondary | ICD-10-CM

## 2015-02-05 MED ORDER — ZOLPIDEM TARTRATE 10 MG PO TABS: 10.0000 mg | ORAL_TABLET | Freq: Every evening | ORAL | Status: DC | PRN

## 2015-02-05 MED ORDER — CIPROFLOXACIN HCL 500 MG PO TABS: 500.0000 mg | ORAL_TABLET | Freq: Two times a day (BID) | ORAL | Status: DC

## 2015-02-05 NOTE — Progress Notes (Signed)
   Subjective:    Patient ID: Brianna Carpenter, female    DOB: [birth year], 64 y.o.   MRN: [id number] Patient arrives office with several concerns Hypertension This is a chronic problem. The current episode started more than 1 year ago. The problem has been gradually improving since onset. The problem is controlled. There are no associated agents to hypertension. There are no known risk factors for coronary artery disease. The current treatment provides significant improvement. There are no compliance problems.    currently on no blood pressure medication.   Patient states that she wants to talk to the doctor about having her tested for an enzyme that her pharmacist has recommended.Marland Kitchen History of sensitivity to lidocaine and other medications. 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 He recommended she have genetic testing. Next  Ongoing challenges with insomnia. Uses Ambien on a when necessary basis.  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 Uses Cipro helps a lot.  Still notes some left groin neuropathic discomfort. See prior note. States has some good days and bad days    Review of Systems No headache no chest pain no back pain abdominal pain no change in bowel habits    Objective:   Physical Exam  Alert vital stable HET normal lungs clear heart rare rhythm ankles without edema      Assessment & Plan:  Impression 1 insomnia discussed #2 hypertension good off meds #3 concern regarding genetic liver enzyme abnormality patient request testing #4 chronic left groin neuro pathic discomfort for which patient claims came from a punch biopsy plan Ambien refill chronic meds refilled blood work as per patient request [facility]

## 2015-02-06 ENCOUNTER — Encounter: Payer: Self-pay | Admitting: Family Medicine

## 2015-02-07 MED ORDER — ZOLPIDEM TARTRATE 10 MG PO TABS: 10.0000 mg | ORAL_TABLET | Freq: Every evening | ORAL | Status: DC | PRN

## 2015-04-03 ENCOUNTER — Telehealth: Payer: Self-pay | Admitting: Family Medicine

## 2015-04-03 NOTE — Telephone Encounter (Signed)
Rx prior auth APPROVED for pt's zolpidem (AMBIEN) 10 MG tablet, expires 03/27/16 through her [facility] Rx coverage, faxed approval to [facility] [place]

## 2015-10-17 ENCOUNTER — Ambulatory Visit: Payer: Medicare Other | Admitting: *Deleted

## 2015-11-16 ENCOUNTER — Encounter: Payer: Self-pay | Admitting: *Deleted

## 2015-11-21 ENCOUNTER — Ambulatory Visit: Payer: Medicare Other | Admitting: *Deleted

## 2015-11-21 DIAGNOSIS — I781 Nevus, non-neoplastic: Secondary | ICD-10-CM

## 2015-11-21 NOTE — Progress Notes (Signed)
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 We spoke at length and I feel that I need to do more research on her condition before performing sclerotherapy. I will call her back for possible reschedule.

## 2015-11-27 ENCOUNTER — Other Ambulatory Visit (HOSPITAL_COMMUNITY): Payer: Self-pay | Admitting: Internal Medicine

## 2015-11-27 DIAGNOSIS — R1011 Right upper quadrant pain: Secondary | ICD-10-CM

## 2015-12-10 ENCOUNTER — Encounter: Payer: Self-pay | Admitting: *Deleted

## 2015-12-12 ENCOUNTER — Ambulatory Visit (HOSPITAL_COMMUNITY)
Admission: RE | Admit: 2015-12-12 | Discharge: 2015-12-12 | Disposition: A | Discharge status: Home / Self Care | Payer: Medicare Other | Source: Ambulatory Visit | Attending: Internal Medicine | Admitting: Internal Medicine

## 2015-12-12 DIAGNOSIS — R1011 Right upper quadrant pain: Secondary | ICD-10-CM | POA: Insufficient documentation

## 2015-12-13 ENCOUNTER — Ambulatory Visit: Payer: Medicare Other | Admitting: *Deleted

## 2015-12-17 ENCOUNTER — Encounter: Payer: Self-pay | Admitting: Gastroenterology

## 2016-01-21 ENCOUNTER — Other Ambulatory Visit: Payer: Self-pay | Admitting: Family Medicine

## 2016-02-05 ENCOUNTER — Ambulatory Visit: Payer: Medicare Other | Admitting: Gastroenterology

## 2016-09-29 DIAGNOSIS — R011 Cardiac murmur, unspecified: Secondary | ICD-10-CM

## 2016-09-29 HISTORY — DX: Cardiac murmur, unspecified: R01.1

## 2017-10-06 ENCOUNTER — Other Ambulatory Visit: Payer: Self-pay

## 2017-10-06 ENCOUNTER — Emergency Department (HOSPITAL_COMMUNITY): Payer: Medicare Other

## 2017-10-06 ENCOUNTER — Encounter (HOSPITAL_COMMUNITY): Payer: Self-pay | Admitting: Emergency Medicine

## 2017-10-06 ENCOUNTER — Encounter (HOSPITAL_COMMUNITY): Payer: Self-pay | Admitting: Family Medicine

## 2017-10-06 ENCOUNTER — Ambulatory Visit (HOSPITAL_COMMUNITY)
Admission: EM | Admit: 2017-10-06 | Discharge: 2017-10-06 | Disposition: A | Discharge status: Home / Self Care | Payer: Medicare Other | Source: Home / Self Care | Attending: Internal Medicine | Admitting: Internal Medicine

## 2017-10-06 DIAGNOSIS — R42 Dizziness and giddiness: Secondary | ICD-10-CM

## 2017-10-06 DIAGNOSIS — R6884 Jaw pain: Secondary | ICD-10-CM | POA: Diagnosis not present

## 2017-10-06 DIAGNOSIS — Z5321 Procedure and treatment not carried out due to patient leaving prior to being seen by health care provider: Secondary | ICD-10-CM | POA: Diagnosis not present

## 2017-10-06 DIAGNOSIS — R079 Chest pain, unspecified: Secondary | ICD-10-CM | POA: Insufficient documentation

## 2017-10-06 DIAGNOSIS — R51 Headache: Secondary | ICD-10-CM | POA: Insufficient documentation

## 2017-10-06 DIAGNOSIS — R0602 Shortness of breath: Secondary | ICD-10-CM

## 2017-10-06 LAB — CBC
HCT: 40 % (ref 36.0–46.0)
HEMOGLOBIN: 13.2 g/dL (ref 12.0–15.0)
MCH: 29.9 pg (ref 26.0–34.0)
MCHC: 33 g/dL (ref 30.0–36.0)
MCV: 90.7 fL (ref 78.0–100.0)
PLATELETS: 306 10*3/uL (ref 150–400)
RBC: 4.41 MIL/uL (ref 3.87–5.11)
RDW: 13.1 % (ref 11.5–15.5)
WBC: 6.9 10*3/uL (ref 4.0–10.5)

## 2017-10-06 LAB — BASIC METABOLIC PANEL
Anion gap: 11 (ref 5–15)
BUN: 14 mg/dL (ref 6–20)
CHLORIDE: 104 mmol/L (ref 101–111)
CO2: 22 mmol/L (ref 22–32)
CREATININE: 0.71 mg/dL (ref 0.44–1.00)
Calcium: 9.6 mg/dL (ref 8.9–10.3)
GFR calc non Af Amer: >60 mL/min (ref >60)
GLUCOSE: 86 mg/dL (ref 65–99)
Potassium: 3.6 mmol/L (ref 3.5–5.1)
Sodium: 137 mmol/L (ref 135–145)

## 2017-10-06 LAB — I-STAT TROPONIN, ED: Troponin i, poc: 0 ng/mL (ref 0.00–0.08)

## 2017-10-06 NOTE — ED Triage Notes (Signed)
Pt here for chest pain that has been going on for about 1 week. Reports worsening today with radiation into her jaw. sts some dizziness and SOB.

## 2017-10-06 NOTE — ED Notes (Signed)
Lab work, radiology results and vital signs reviewed, no critical results at this time, no change in acuity indicated.  

## 2017-10-06 NOTE — ED Provider Notes (Signed)
[facility]    CSN: [id number] Arrival date & time: 10/06/17  1739     History   Chief Complaint Chief Complaint  Patient presents with  . Chest Pain    HPI BLOSSOM FAMOUS is a 67 y.o. female.   Mayanna presents with complaints of right jaw pain, headache, neck ache, sensation of "something is wrong.," shortness of breath and weakness which significantly worsened tonight. Dizziness. Without nausea or vomiting. Denies any previous similar. States has had some neck pain for a few days but today feels worse. History of neck pain, htn. Without diabetes.      Past Medical History:  Diagnosis Date  . Arthritis    Feet  . Chronic neck pain   . Hypertension   . Hypertensive retinopathy   . Insomnia   . Plantar fasciitis   . TMJ (temporomandibular joint syndrome)     Patient Active Problem List   Diagnosis Date Noted  . Meralgia paresthetica of left side 08/22/2014  . Neuropathic pain of left thigh 07/01/2014  . Essential hypertension, benign 10/23/2013  . Insomnia 02/11/2013  . Hallux limitus 04/30/2011    Past Surgical History:  Procedure Laterality Date  . ABDOMINAL HYSTERECTOMY    . ADENOIDECTOMY    . BACK SURGERY    . eye correction    . KIDNEY STONE SURGERY    . left foot surgery    . PARTIAL HYSTERECTOMY    . TONSILLECTOMY      OB History    No data available       Home Medications    Prior to Admission medications   Medication Sig Start Date End Date Taking? Authorizing Provider  ALOE VERA PO Take 250 mg by mouth daily.    [provider]  aspirin EC 81 MG tablet Take 81 mg by mouth daily.    [provider]  Cholecalciferol (VITAMIN D3) 2000 UNITS TABS Take 1 tablet by mouth daily.    [provider]  ciprofloxacin (CIPRO) 500 MG tablet Take 1 tablet (500 mg total) by mouth 2 (two) times daily. For 10 days 02/05/15   Merlyn Albert, MD  Cyanocobalamin (VITAMIN B 12 PO) Take by mouth daily.    [provider]  HYDROcodone-acetaminophen (NORCO/VICODIN) 5-325 MG per tablet Take 1 tablet by mouth every 6 (six) hours as needed. 06/28/14   Merlyn Albert, MD  Nutritional Supplements (DHEA PO) Take 5 mg by mouth daily.    [provider]  zolpidem (AMBIEN) 10 MG tablet Take 1 tablet (10 mg total) by mouth at bedtime as needed for sleep. 02/07/15   Merlyn Albert, MD    Family History Family History  Problem Relation Age of Onset  . Heart disease Father     Social History Social History   Tobacco Use  . Smoking status: Never Smoker  Substance Use Topics  . Alcohol use: Yes    Alcohol/week: 3.5 oz    Types: 7 Standard drinks or equivalent per week  . Drug use: No     Allergies   Codeine; Corn syrup [glucose]; Latex; Prevacid [lansoprazole]; and Thimerosol   Review of Systems Review of Systems   Physical Exam Triage Vital Signs ED Triage Vitals  Enc Vitals Group     BP 10/06/17 1752 (!) 148/73     Pulse Rate 10/06/17 1752 85     Resp 10/06/17 1752 18     Temp 10/06/17 1752 98.2 F (36.8 C)  Temp src --      SpO2 10/06/17 1752 98 %     Weight --      Height --      Head Circumference --      Peak Flow --      Pain Score 10/06/17 1756 7     Pain Loc --      Pain Edu? --      Excl. in GC? --    No data found.  Updated Vital Signs BP (!) 148/73   Pulse 85   Temp 98.2 F (36.8 C)   Resp 18   SpO2 98%   Visual Acuity Right Eye Distance:   Left Eye Distance:   Bilateral Distance:    Right Eye Near:   Left Eye Near:    Bilateral Near:     Physical Exam   UC Treatments / Results  Labs (all labs ordered are listed, but only abnormal results are displayed) Labs Reviewed - No data to display  EKG  EKG Interpretation None       Radiology No results found.  Procedures Procedures (including critical care time)  Medications Ordered in UC Medications - No data to display   Initial Impression / Assessment and Plan / UC  Course  I have reviewed the triage vital signs and the nursing notes.  Pertinent labs & imaging results that were available during my care of the patient were reviewed by me and considered in my medical decision making (see chart for details).     ekg obtained by nursing, without acute stelevation, nsr. Patient with significant shortness of breath with activity as well as with jaw pain, concerning for ACS. Patient provided wheelchair transport to ED for further cardiac work up and evaluation, Patient verbalized understanding and agreeable to plan.    Final Clinical Impressions(s) / UC Diagnoses   Final diagnoses:  Shortness of breath  Jaw pain    ED Discharge Orders    None       Controlled Substance Prescriptions [place] Controlled Substance Registry consulted? Not Applicable   Georgetta Haber, NP 10/06/17 1816

## 2017-10-06 NOTE — ED Triage Notes (Signed)
Pt. Stated, Brianna Carpenter been feeling dizzy for the last 2 weeks and today for the last 2 hours Ive had some face pain that's gone away and now its in my right breast. Ive had cold hands and feet due to a pinged nerve in my neck.

## 2017-10-06 NOTE — Discharge Instructions (Signed)
Symptoms concerning for ACS at this time, please go to er for complete cardiac evaluation.

## 2017-10-07 ENCOUNTER — Emergency Department (HOSPITAL_COMMUNITY)
Admission: EM | Admit: 2017-10-07 | Discharge: 2017-10-07 | Disposition: A | Discharge status: Home / Self Care | Payer: Medicare Other | Attending: Emergency Medicine | Admitting: Emergency Medicine

## 2017-10-07 NOTE — ED Notes (Signed)
No answer for vitals  

## 2017-12-14 ENCOUNTER — Other Ambulatory Visit: Payer: Self-pay

## 2017-12-14 ENCOUNTER — Emergency Department (HOSPITAL_COMMUNITY)
Admission: EM | Admit: 2017-12-14 | Discharge: 2017-12-14 | Disposition: A | Discharge status: Home / Self Care | Payer: Medicare Other | Attending: Emergency Medicine | Admitting: Emergency Medicine

## 2017-12-14 ENCOUNTER — Encounter (HOSPITAL_COMMUNITY): Payer: Self-pay

## 2017-12-14 DIAGNOSIS — I10 Essential (primary) hypertension: Secondary | ICD-10-CM | POA: Insufficient documentation

## 2017-12-14 DIAGNOSIS — Z91040 Latex allergy status: Secondary | ICD-10-CM | POA: Insufficient documentation

## 2017-12-14 DIAGNOSIS — R42 Dizziness and giddiness: Secondary | ICD-10-CM | POA: Diagnosis present

## 2017-12-14 HISTORY — DX: Dizziness and giddiness: R42

## 2017-12-14 LAB — BASIC METABOLIC PANEL
Anion gap: 12 (ref 5–15)
BUN: 12 mg/dL (ref 6–20)
CALCIUM: 9.5 mg/dL (ref 8.9–10.3)
CO2: 24 mmol/L (ref 22–32)
Chloride: 102 mmol/L (ref 101–111)
Creatinine, Ser: 0.69 mg/dL (ref 0.44–1.00)
GFR calc Af Amer: >60 mL/min (ref >60)
GLUCOSE: 103 mg/dL — AB (ref 65–99)
POTASSIUM: 4 mmol/L (ref 3.5–5.1)
SODIUM: 138 mmol/L (ref 135–145)

## 2017-12-14 LAB — I-STAT TROPONIN, ED: Troponin i, poc: 0 ng/mL (ref 0.00–0.08)

## 2017-12-14 LAB — URINALYSIS, ROUTINE W REFLEX MICROSCOPIC
Bilirubin Urine: NEGATIVE
GLUCOSE, UA: NEGATIVE mg/dL
HGB URINE DIPSTICK: NEGATIVE
Ketones, ur: NEGATIVE mg/dL
Leukocytes, UA: NEGATIVE
Nitrite: NEGATIVE
PROTEIN: NEGATIVE mg/dL
Specific Gravity, Urine: 1.003 — ABNORMAL LOW (ref 1.005–1.030)
pH: 5 (ref 5.0–8.0)

## 2017-12-14 LAB — CBC
HEMATOCRIT: 39.4 % (ref 36.0–46.0)
Hemoglobin: 13.1 g/dL (ref 12.0–15.0)
MCH: 30.5 pg (ref 26.0–34.0)
MCHC: 33.2 g/dL (ref 30.0–36.0)
MCV: 91.6 fL (ref 78.0–100.0)
Platelets: 300 10*3/uL (ref 150–400)
RBC: 4.3 MIL/uL (ref 3.87–5.11)
RDW: 13.3 % (ref 11.5–15.5)
WBC: 6.4 10*3/uL (ref 4.0–10.5)

## 2017-12-14 MED ORDER — SODIUM CHLORIDE 0.9 % IV BOLUS (SEPSIS): 500.0000 mL | Freq: Once | INTRAVENOUS | Status: DC

## 2017-12-14 NOTE — ED Provider Notes (Signed)
[facility] EMERGENCY DEPARTMENT Provider Note   CSN: [id number] Arrival date & time: 12/14/17  1236     History   Chief Complaint Chief Complaint  Patient presents with  . Dizziness    HPI Brianna Carpenter is a 67 y.o. female.  HPI   Pt is a 67 y/o female who presents to the ED today c/o dizziness that began this morning around 11am.  She has had symptoms intermittently over several months, but today sxs worsened. States sxs began after taking an antibiotic. States that currently she does not feel dizzy and does not feel dizzy when she turns her head. Does state that she feels off balance when she walks however sxs are intermittent. States she was able to walk into the ER independently. States she has been seen by here PCP for similar sxs and was recommended to start meclizine. However she declined to take medication stating that she is concerned she metabolizes meds differently due to h/o alpha 1 antitrypsin deficiency.  States she has a symmetric "squeezing/weak" feeling in stocking glove distribution for several months. States she was in the ED on 1/19 for evaluation of this and had negative workup. Has been seen by PCP for this as well and there was suspicion for cervical involvement. xrays showed arthritis according to pt. States she has MRI scheduled of cervical spine, however has had to postpone study due to sxs of dizziness.  She denies chest pain, sob. States burning pain in chest that she experienced in waiting room has resolved. Has some pain to bilat neck muscles which she stats is chronic and due to arthritis. Denies fevers, headaches, vision changes, photophobia, abd pain, nvd, dysuria, urinary frequency.  Past Medical History:  Diagnosis Date  . Arthritis    Feet  . Chronic neck pain   . Hypertension   . Hypertensive retinopathy   . Insomnia   . Plantar fasciitis   . TMJ (temporomandibular joint syndrome)   . Vertigo     Patient Active Problem  List   Diagnosis Date Noted  . Meralgia paresthetica of left side 08/22/2014  . Neuropathic pain of left thigh 07/01/2014  . Essential hypertension, benign 10/23/2013  . Insomnia 02/11/2013  . Hallux limitus 04/30/2011    Past Surgical History:  Procedure Laterality Date  . ABDOMINAL HYSTERECTOMY    . ADENOIDECTOMY    . BACK SURGERY    . eye correction    . KIDNEY STONE SURGERY    . left foot surgery    . PARTIAL HYSTERECTOMY    . TONSILLECTOMY      OB History    No data available       Home Medications    Prior to Admission medications   Medication Sig Start Date End Date Taking? Authorizing Provider  Cholecalciferol (VITAMIN D3) 2000 UNITS TABS Take 1 tablet by mouth daily.   Yes [provider]  Cyanocobalamin (VITAMIN B 12 PO) Take 1,000 mcg by mouth daily.    Yes [provider]  Melatonin 1 MG TABS Take 1 mg by mouth at bedtime.   Yes [provider]  sodium chloride (OCEAN) 0.65 % SOLN nasal spray Place 2 sprays into both nostrils as needed for congestion.   Yes [provider]  zolpidem (AMBIEN) 5 MG tablet Take 5 mg by mouth at bedtime. 12/03/17  Yes [provider]  HYDROcodone-acetaminophen (NORCO/VICODIN) 5-325 MG per tablet Take 1 tablet by mouth every 6 (six) hours as needed. Patient  not taking: Reported on 12/14/2017 06/28/14   Merlyn Albert, MD  zolpidem (AMBIEN) 10 MG tablet Take 1 tablet (10 mg total) by mouth at bedtime as needed for sleep. Patient not taking: Reported on 12/14/2017 02/07/15   Merlyn Albert, MD    Family History Family History  Problem Relation Age of Onset  . Heart disease Father     Social History Social History   Tobacco Use  . Smoking status: Never Smoker  . Smokeless tobacco: Never Used  Substance Use Topics  . Alcohol use: Yes    Frequency: Never    Comment: occ  . Drug use: No     Allergies   Fentanyl; Codeine; Corn syrup [glucose]; Latex; Prevacid [lansoprazole];  and Thimerosol   Review of Systems Review of Systems  Constitutional: Negative for fatigue and fever.  HENT: Negative for congestion and rhinorrhea.   Eyes: Negative for photophobia and visual disturbance.  Respiratory: Negative for shortness of breath.   Cardiovascular: Negative for chest pain.  Gastrointestinal: Negative for abdominal pain, constipation, diarrhea, nausea and vomiting.  Genitourinary: Negative for dysuria, flank pain, frequency and urgency.  Musculoskeletal: Positive for neck pain. Negative for back pain.  Skin: Negative for rash.  Neurological: Positive for dizziness and weakness. Negative for syncope, numbness and headaches.     Physical Exam Updated Vital Signs BP (!) 145/71   Pulse 73   Temp 98.2 F (36.8 C)   Resp 13   Ht 5\' 4"  (1.626 m)   Wt 63 kg (139 lb)   SpO2 99%   BMI 23.86 kg/m   Physical Exam  Constitutional: She is oriented to person, place, and time. She appears well-developed and well-nourished. No distress.  Nontoxic, no acute distress,  HENT:  Head: Normocephalic and atraumatic.  Right Ear: External ear normal.  Left Ear: External ear normal.  Nose: Nose normal.  Mouth/Throat: Oropharynx is clear and moist.  Bilateral TMs are not erythematous, appeared to be somewhat bulging bilaterally  Eyes: Conjunctivae and EOM are normal. Pupils are equal, round, and reactive to light.  No nystagmus  Neck: Normal range of motion. Neck supple.  No nuchal rigidity  Cardiovascular: Normal rate, regular rhythm, normal heart sounds and intact distal pulses.  No murmur heard. Pulmonary/Chest: Effort normal and breath sounds normal. No respiratory distress. She has no wheezes.  Abdominal: Soft. Bowel sounds are normal. She exhibits no distension. There is no tenderness.  Musculoskeletal: She exhibits no edema.  No TTP to the cervical, thoracic, or lumbar spine. No pain to the paraspinous muscles.  Neurological: She is alert and oriented to person,  place, and time.  Mental Status:  Alert, thought content appropriate, able to give a coherent history. Speech fluent without evidence of aphasia. Able to follow 2 step commands without difficulty.  Cranial Nerves:  II:  Peripheral visual fields grossly normal, pupils equal, round, reactive to light III,IV, VI: ptosis not present, extra-ocular motions intact bilaterally  V,VII: smile symmetric, facial light touch sensation equal VIII: hearing grossly normal to voice  X: uvula elevates symmetrically  XI: bilateral shoulder shrug symmetric and strong XII: midline tongue extension without fassiculations Motor:  Normal tone. 5/5 strength of BUE and BLE major muscle groups including strong and equal grip strength and dorsiflexion/plantar flexion Sensory: light touch normal in all extremities. Cerebellar: normal finger-to-nose with bilateral upper extremities Gait: normal gait and balance. Able to walk on toes and heels with ease.  CV: 2+ radial and DP/PT pulses Date of pronator  drift.  Negative Romberg.  Skin: Skin is warm and dry.  Psychiatric: She has a normal mood and affect.  Nursing note and vitals reviewed.    ED Treatments / Results  Labs (all labs ordered are listed, but only abnormal results are displayed) Labs Reviewed  BASIC METABOLIC PANEL - Abnormal; Notable for the following components:      Result Value   Glucose, Bld 103 (*)    All other components within normal limits  URINALYSIS, ROUTINE W REFLEX MICROSCOPIC - Abnormal; Notable for the following components:   Color, Urine STRAW (*)    Specific Gravity, Urine 1.003 (*)    All other components within normal limits  CBC  I-STAT TROPONIN, ED    EKG  EKG Interpretation  Date/Time:  Monday December 14 2017 13:50:39 EDT Ventricular Rate:  75 PR Interval:  122 QRS Duration: 84 QT Interval:  394 QTC Calculation: 439 R Axis:   68 Text Interpretation:  Normal sinus rhythm Normal ECG Since last tracing of earlier today  No significant change was found Confirmed by Mancel Bale [phone]) on 12/14/2017 5:36:26 PM       Radiology No results found.  Procedures Procedures (including critical care time)  Medications Ordered in ED Medications - No data to display   Initial Impression / Assessment and Plan / ED Course  I have reviewed the triage vital signs and the nursing notes.  Pertinent labs & imaging results that were available during my care of the patient were reviewed by me and considered in my medical decision making (see chart for details).  Discussed pt presentation and exam findings with Dr. Effie Shy, who personally evaluated the patient and agrees with the plan for discharge with outpatient follow-up with neurology and ENT.    3:56 PM Discussed case with radiology Dr. Chase Picket who states that based on pt sxs will not need contrast for MRI brain.   Discussed with patient that we can complete MRI of brain and cervical spine here today if she wishes, or she can complete studies as an outpatient and we will give her follow-up for neurology.  Initially patient had stated that she would like to stay for the MRIs, however during the visit she changed her mind and stated she would like to leave and have the studies completed as an outpatient.   Final Clinical Impressions(s) / ED Diagnoses   Final diagnoses:  Dizziness   Patient is a 67 year old female presenting for evaluation of dizziness.  Patient hemodynamically stable and afebrile.  Normal neurologic exam.  Able to ambulate throughout the emergency department without difficulty and without assistance.  Do not suspect acute CVA at this time, and do not suspect central etiology of vertigo as patient symptoms are intermittent and she has a normal neurologic exam. patient has been seen by her primary care provider for similar symptoms and was recommended to take meclizine which she refused to do.  She is also been referred to orthopedics for MRI of the  cervical spine, but has yet to complete the study.  Her lab work was benign and her UA was benign as well. ECG with NSR HR 75, no ischemic changes.  Feel that symptoms have been stable for several months and patient is safe to follow-up as an outpatient with a neurologist for further evaluation of her symptoms.  She was offered to have the studies completed while in the ED, however she would prefer to follow-up as an outpatient to have the studies completed.  I  advised her to follow-up with her primary care doctor about her symptoms as well as to follow-up with neurology.  She also has an ENT appointment tomorrow morning, I advised her to keep this appointment.  Further advised her to return to the emergency department for any new or worsening symptoms.  All questions were answered and patient understands the plan and reasons to return.  ED Discharge Orders        Ordered    Ambulatory referral to Neurology    Comments:  An appointment is requested in approximately: 1-2 weeks   12/14/17 1725       Karrie Meres, PA-C 12/15/17 0000    Mancel Bale, MD 12/16/17 [phone]

## 2017-12-14 NOTE — ED Notes (Signed)
Pt states she understands instructions. Home stable with steady gait. 

## 2017-12-14 NOTE — ED Notes (Signed)
PT ambulatory to restroom. Gait steady, pt reported some dizziness

## 2017-12-14 NOTE — ED Provider Notes (Signed)
  Face-to-face evaluation   History: She presents for evaluation of dizziness.  She was seen by me at 5:45 PM.  At this time she denies headache, dizziness, nausea, vomiting, weakness or paresthesia.  She relates her symptoms as starting after taking Augmentin and Zithromax for sinus infection.  She has a follow-up appointment with an ENT doctor tomorrow.  Physical exam:, Calm, cooperative.  No dysarthria, aphasia or nystagmus.  Normal strength and sensation arms and legs bilaterally.  Plan-follow-up with ENT tomorrow as scheduled and see neurology after that if needed for further evaluation of vertigo symptom.  Medical screening examination/treatment/procedure(s) were conducted as a shared visit with non-physician practitioner(s) and myself.  I personally evaluated the patient during the encounter    Mancel Bale, MD 12/16/17 [phone]

## 2017-12-14 NOTE — ED Notes (Signed)
Pt states acute onset chest burning while in waiting room.  Repeat EKG and I-stat troponin ordered.

## 2017-12-14 NOTE — Discharge Instructions (Signed)
You were given an ambulatory referral to neurology.  The neurology office will contact you to set up an appointment for follow-up.  Please keep your appointment with the ENT doctor tomorrow as already scheduled.  Please return to the emergency department for any new or worsening symptoms in the meantime including any headaches, fevers, persistent dizziness, vomiting, persistent headaches, vision changes, or any new or worsening symptoms.

## 2017-12-14 NOTE — ED Triage Notes (Signed)
Pt endorses intermittent dizziness x 3 weeks ago since having the flu. Pt was diagnosed with vertigo. Pt states that dizziness is getting worse and "my arms and legs both feel tight" VSS.

## 2018-01-05 ENCOUNTER — Ambulatory Visit: Payer: Medicare Other | Admitting: Internal Medicine

## 2018-01-07 ENCOUNTER — Ambulatory Visit: Payer: Medicare Other | Admitting: Family Medicine

## 2018-01-21 ENCOUNTER — Other Ambulatory Visit: Payer: Self-pay

## 2018-01-21 ENCOUNTER — Ambulatory Visit (INDEPENDENT_AMBULATORY_CARE_PROVIDER_SITE_OTHER): Payer: Medicare Other | Admitting: Neurology

## 2018-01-21 ENCOUNTER — Encounter

## 2018-01-21 ENCOUNTER — Encounter: Payer: Self-pay | Admitting: Neurology

## 2018-01-21 DIAGNOSIS — M5020 Other cervical disc displacement, unspecified cervical region: Secondary | ICD-10-CM

## 2018-01-21 DIAGNOSIS — H8110 Benign paroxysmal vertigo, unspecified ear: Secondary | ICD-10-CM | POA: Insufficient documentation

## 2018-01-21 DIAGNOSIS — M4802 Spinal stenosis, cervical region: Secondary | ICD-10-CM | POA: Diagnosis not present

## 2018-01-21 NOTE — Progress Notes (Signed)
[facility]  PATIENT: Brianna Carpenter DOB: 06-05-1951  REFERRING DOCTOR OR PCP:  Devra Dopp SOURCE: patient, notes from PCP, imaging reports   _________________________________   HISTORICAL  CHIEF COMPLAINT:  Chief Complaint  Patient presents with  . Dizziness    Dizziness onset Feb/March following tx. for flu-like sx. (but tested neg. for flu).  Sts. dizziness is intermittent and worse with position changes. Sts. has also had an abnormal MRI c-spine and would like Dr. Bonnita Hollow opinion on if  surgery is needed.  Sts. she has already seen Dr, Wynetta Emery who rec. surgery/fim    HISTORY OF PRESENT ILLNESS:  I had the pleasure seeing your patient, Brianna Carpenter, [facility] for neurologic consultation regarding her dizziness and neck pain.  She began to have dizziness in late February or early March after she had flulike symptoms.  She did not test positive for flu.  She was placed on Amoxicillin and symptoms started while she was on that medication.   She was then placed on Erythromycin.   Her dizziness is positional and she gets a translational 'jelly leg' vertigo.     Epley maneuvers have not helped.   She does not feel her eyes beating when the vertigo occurs.     It is most likely to occur with getting up from a lying position and reaching/looking up.  It occasionally has occurred while gardening.  She feels it is just slightly better than she was in March.   Stating active helps.   She uses a shower chair for safety.  She went to the ED in January due to right facial sensory symptoms.   She does not have much neck pain.   She feels balance has been a little worse x 1 year.   She has some urinary frequency x many years (she believes similar to her friends).        She has trouble tolerating many medications so has not taken meclizine or other medication.         She saw cardiology due to her dizziness and she has a mild murmur but nothin that needed to  be acted on.   An MRI of the cervical spine has shown moderately severe spinal stenosis at C4C5 and moderate stenosis at other levels.    She saw Dr. Wynetta Emery and surgery was recommended.    She takes zolpidem for insomnia with benefit.     I personally reviewed the MRI of the cervical spine dated 12/21/2017 and mostly concur with the interpretation.  At C4-C5 there is a large right paramedian disc osteophyte complex causing moderately severe spinal stenosis.  Additionally that level there is moderately severe foraminal narrowing that could cause compression of either of the C5 nerve roots.  There is also moderate spinal stenosis at C3-C4 due to disc protrusion, uncovertebral spurring, right facet hypertrophy and bilateral ligamenta flava hypertrophy.  At that level, there is moderately severe right foraminal narrowing that could lead to right C4 nerve root compression.  At C5-C6, there is mild to moderate spinal stenosis due to disc protrusion, uncovertebral spurring there is moderate bilateral foraminal narrowing.  At C6-C7, there is mild to moderate spinal stenosis and left foraminal narrowing.  Cervical spine MRI 12/21/2017 official IMPRESSION:  1.Large right central disc osteophyte complex at C4-5 with associated moderately severe spinal stenosis and severe bilateral foraminal stenosis resulting in spinal cord compression.  2. 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   REVIEW OF SYSTEMS: Constitutional: No fevers, chills, sweats, or change in appetite Eyes: No visual changes, double vision, eye pain Ear, nose and throat: No hearing loss, ear pain, nasal congestion, sore throat Cardiovascular: No chest pain, palpitations Respiratory: No shortness of breath at rest or with exertion.   No wheezes GastrointestinaI: No nausea, vomiting, diarrhea, abdominal pain, fecal incontinence Genitourinary: No dysuria, urinary  retention or frequency.  No nocturia. Musculoskeletal: No neck pain, back pain Integumentary: No rash, pruritus, skin lesions Neurological: as above Psychiatric: No depression at this time.  No anxiety Endocrine: No palpitations, diaphoresis, change in appetite, change in weigh or increased thirst Hematologic/Lymphatic: No anemia, purpura, petechiae. Allergic/Immunologic: No itchy/runny eyes, nasal congestion, recent allergic reactions, rashes  ALLERGIES: Allergies  Allergen Reactions  . Fentanyl Anaphylaxis  . Codeine   . Corn Syrup [Glucose]     High Fructose Corn Syrup   . Latex   . Prevacid [Lansoprazole]     Hair fell out  . Thimerosol     HOME MEDICATIONS:  Current Outpatient Medications:  .  Cholecalciferol (VITAMIN D3) 2000 UNITS TABS, Take 1 tablet by mouth daily., Disp: , Rfl:  .  Cyanocobalamin (VITAMIN B 12 PO), Take 1,000 mcg by mouth daily. , Disp: , Rfl:  .  Melatonin 1 MG TABS, Take 1 mg by mouth at bedtime., Disp: , Rfl:  .  sodium chloride (OCEAN) 0.65 % SOLN nasal spray, Place 2 sprays into both nostrils as needed for congestion., Disp: , Rfl:  .  zolpidem (AMBIEN) 10 MG tablet, Take 1 tablet (10 mg total) by mouth at bedtime as needed for sleep., Disp: 30 tablet, Rfl: 5 .  HYDROcodone-acetaminophen (NORCO/VICODIN) 5-325 MG per tablet, Take 1 tablet by mouth every 6 (six) hours as needed. (Patient not taking: Reported on 01/21/2018), Disp: 36 tablet, Rfl: 0 .  zolpidem (AMBIEN) 5 MG tablet, Take 5 mg by mouth at bedtime., Disp: , Rfl: 0  PAST MEDICAL HISTORY: Past Medical History:  Diagnosis Date  . Arthritis    Feet  . Chronic neck pain   . Hypertension   . Hypertensive retinopathy   . Insomnia   . Plantar fasciitis   . TMJ (temporomandibular joint syndrome)   . Vertigo     PAST SURGICAL HISTORY: Past Surgical History:  Procedure Laterality Date  . ABDOMINAL HYSTERECTOMY    . ADENOIDECTOMY    . BACK SURGERY    . eye correction    . KIDNEY  STONE SURGERY    . left foot surgery    . PARTIAL HYSTERECTOMY    . TONSILLECTOMY      FAMILY HISTORY: Family History  Problem Relation Age of Onset  . Heart disease Father     SOCIAL HISTORY:  Social History   Socioeconomic History  . Marital status: Divorced    Spouse name: Not on file  . Number of children: Not on file  . Years of education: Not on file  . Highest education level: Not on file  Occupational History  . 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 Financial resource strain: Not on file  . Food insecurity:    Worry: Not on file    Inability: Not on file  . Transportation needs:    Medical: Not on file    Non-medical: Not on file  Tobacco Use  . Smoking status: Never Smoker  . Smokeless tobacco:  Never Used  Substance and Sexual Activity  . Alcohol use: Yes    Frequency: Never    Comment: occ  . Drug use: No  . Sexual activity: Never    Birth control/protection: Abstinence  Lifestyle  . Physical activity:    Days per week: Not on file    Minutes per session: Not on file  . Stress: Not on file  Relationships  . Social connections:    Talks on phone: Not on file    Gets together: Not on file    Attends religious service: Not on file    Active member of club or organization: Not on file    Attends meetings of clubs or organizations: Not on file    Relationship status: Not on file  . Intimate partner violence:    Fear of current or ex partner: Not on file    Emotionally abused: Not on file    Physically abused: Not on file    Forced sexual activity: Not on file  Other Topics Concern  . Not on file  Social History Narrative   3x a week---  Elliptical and treadmill     PHYSICAL EXAM  Vitals:   01/21/18 1022  BP: (!) 142/89  Pulse: 70  Resp: 16  Weight: 139 lb 8 oz (63.3 kg)  Height: 5\' 4"  (1.626 m)    Body mass index is 23.95 kg/m.   Orthostatic VS for the past 24 hrs:  BP-  Lying Pulse- Lying BP- Sitting Pulse- Sitting BP- Standing at 0 minutes Pulse- Standing at 0 minutes  01/21/18 1033 142/89 70 172/83 71 152/88 73      General: The patient is well-developed and well-nourished and in no acute distress  Eyes:  Funduscopic exam shows normal optic discs and retinal vessels.  Neck: The neck is supple, no carotid bruits are noted.  The neck is nontender.  Cardiovascular: The heart has a regular rate and rhythm with a normal S1 and S2. There were no murmurs, gallops or rubs. Lungs are clear to auscultation.  Skin: Extremities are without significant edema.  Musculoskeletal:  Back is nontender  Neurologic Exam  Mental status: The patient is alert and oriented x 3 at the time of the examination. The patient has apparent normal recent and remote memory, with an apparently normal attention span and concentration ability.   Speech is normal.  Cranial nerves: Extraocular movements are full. Pupils are equal, round, and reactive to light and accomodation.  Visual fields are full.  Facial symmetry is present. There is good facial sensation to soft touch bilaterally.Facial strength is normal.  Trapezius and sternocleidomastoid strength is normal. No dysarthria is noted.  The tongue is midline, and the patient has symmetric elevation of the soft palate. No obvious hearing deficits are noted.  Motor:  Muscle bulk is normal.   Tone is normal. Strength is  5 / 5 in all 4 extremities.   Sensory: Sensory testing is intact to pinprick, soft touch and vibration sensation in all 4 extremities.  Coordination: Cerebellar testing reveals good finger-nose-finger and heel-to-shin bilaterally.  Gait and station: Station is normal.   Gait is normal. Tandem gait is normal. Romberg is negative.   Reflexes: Deep tendon reflexes are symmetric and normal bilaterally.   Plantar responses are flexor.  Other: The Dix-Hallpike maneuver did not provoke any nystagmus to either side though  she did feel vertiginous with the maneuver.    DIAGNOSTIC DATA (LABS, IMAGING, TESTING) - I reviewed patient records,  labs, notes, testing and imaging myself where available.  Lab Results  Component Value Date   WBC 6.4 12/14/2017   HGB 13.1 12/14/2017   HCT 39.4 12/14/2017   MCV 91.6 12/14/2017   PLT 300 12/14/2017      Component Value Date/Time   NA 138 12/14/2017 1301   K 4.0 12/14/2017 1301   CL 102 12/14/2017 1301   CO2 24 12/14/2017 1301   GLUCOSE 103 (H) 12/14/2017 1301   BUN 12 12/14/2017 1301   CREATININE 0.69 12/14/2017 1301   CREATININE 0.72 05/05/2014 0820   CALCIUM 9.5 12/14/2017 1301   PROT 6.9 08/26/2013 1201   ALBUMIN 4.6 08/26/2013 1201   AST 17 08/26/2013 1201   ALT 14 08/26/2013 1201   ALKPHOS 70 08/26/2013 1201   BILITOT 0.6 08/26/2013 1201   GFRNONAA >60 12/14/2017 1301   GFRAA >60 12/14/2017 1301   Lab Results  Component Value Date   CHOL 239 (H) 05/05/2014   HDL 88 05/05/2014   LDLCALC 127 (H) 05/05/2014   TRIG 122 05/05/2014   CHOLHDL 2.7 05/05/2014   No results found for: HGBA1C No results found for: VITAMINB12 Lab Results  Component Value Date   TSH 1.672 08/26/2013       ASSESSMENT AND PLAN  No diagnosis found.   In summary, Brianna Carpenter is a 67 year old woman with positional vertigo.  The [employer] maneuver did not provoke rotatory nystagmus though she is better now that she was a few weeks ago.  I suspect that she had a paroxysmal positional vertigo.  She is continuing with vestibular rehab.  I instructed her on the [facility] exercises as her symptoms have not resolved with the Epley maneuvers.   Her second issue is multilevel degenerative changes of her cervical spine with moderately severe spinal stenosis at C4-C5 and other changes at C3-C4, C5-C6 and C6-C7.  She is scheduled to have an EMG study at neurosurgery.  She does not show evidence of spinal cord compression.  Given the degree of spinal stenosis, surgery could be  considered as she has high risk for progression of the degenerative changes or sudden worsening in the event of trauma.  She returns to see Dr. Wynetta Emery after the NCV/EMG which will probably helped to guide the extent of cervical fusion required.    Richard A. Epimenio Foot, MD, [facility] 01/21/2018, 10:30 AM Certified in Neurology, Clinical Neurophysiology, Sleep Medicine, Pain Medicine and Neuroimaging  [facility] [address] [place], [place] [address] [phone]

## 2018-01-22 ENCOUNTER — Ambulatory Visit: Payer: Self-pay | Admitting: Internal Medicine

## 2018-07-27 ENCOUNTER — Encounter: Payer: Self-pay | Admitting: Internal Medicine

## 2018-07-27 ENCOUNTER — Ambulatory Visit (INDEPENDENT_AMBULATORY_CARE_PROVIDER_SITE_OTHER): Payer: Medicare Other | Admitting: Internal Medicine

## 2018-07-27 VITALS — BP 128/76 | HR 82 | Ht 63.0 in | Wt 141.4 lb

## 2018-07-27 DIAGNOSIS — Z87898 Personal history of other specified conditions: Secondary | ICD-10-CM

## 2018-07-27 DIAGNOSIS — R0789 Other chest pain: Secondary | ICD-10-CM | POA: Diagnosis not present

## 2018-07-27 DIAGNOSIS — R0602 Shortness of breath: Secondary | ICD-10-CM | POA: Diagnosis not present

## 2018-07-27 LAB — NITRIC OXIDE: NITRIC OXIDE: 26

## 2018-07-27 NOTE — Patient Instructions (Signed)
ICD-10-CM   1. History of chemical exposure Z87.898   2. Chest tightness R07.89 Nitric oxide  3. 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

## 2018-07-27 NOTE — Progress Notes (Signed)
OV 07/27/2018  Subjective:  Patient ID: Brianna Carpenter Nurse, female , DOB: 03-Jul-1951 , age 67 y.o. , MRN: [id number] , ADDRESS: [address] [place] [address]   07/27/2018 -   Chief Complaint  Patient presents with  . 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      HPI Brianna Carpenter 67 y.o. -female here for a new consult.  Self-referred.  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  But she has never smoked.  She does not have any biologic kids.  She tells me that approximately 8 years ago while at home she inhaled some bleach which was left open and after the suffered from shortness of breath and chest tightness that slowly work itself out over the next few to several years.  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  There was a spill of this powder.  Apparently this got sucked into her duct system.  And then 2 days later he injected out off the duct system and she started inhaling the chemicals.  Soon after she started noticing shortness of breath, chest tightness and even eye  burned and also nasal irritation.  A day later she figured this was because of the Borax.  She then checked herself into a hotel for 2 days.  She then returned home in the home still continue to smell.  She also had smell and cough.  She got cleaning system and had a house remediated.  She says the house is now almost free of smell.  She is able to live in the house now.  However she is continues to have shortness of breath with exertion and chest tightness but not much of a cough     she does not have any previous history of asthma or smoking history.  She feels her symptoms are improved since it started.  Symptoms are rated as  moderate.  There is no chest pain or diaphoresis.  She saw Dr. Jerre Simon in [place].  She did a pulmonary function test with him on June 28, 2018 but I visualized these results and it is a poor technical quality and uninterpretable.  She had a chest x-ray with him that is clear.  I do not have this image with me.  She also tells me that in general medications do not agree with her because of numerous allergies.  Therefore she wants a very simplistic line of treatment.  Today we did exam nitric oxide was normal at 26.  We did walking desaturation test on 85 feet x 3 laps on room air: Resting pulse ox 99%.  Resting heart rate 72/min.  Final pulse ox 97% and final heart rate 99/min.  Heart tachycardic but otherwise normal.    Results for DARICE, Brianna Carpenter (MRN [id number]) as of 07/27/2018 09:47  Ref. Range 06/20/2009 23:59 02/24/2011 14:26  Eosinophils Absolute Latest Ref Range: 0.0 - 0.7 K/uL 0.0 0.1   ROS - per HPI     has a past medical history of Arthritis, Chronic neck pain, Hypertension, Hypertensive retinopathy, Insomnia, Plantar fasciitis, TMJ (temporomandibular joint syndrome), and Vertigo.   reports that she has never smoked. She  has never used smokeless tobacco.  Past Surgical History:  Procedure Laterality Date  . ABDOMINAL HYSTERECTOMY    . ADENOIDECTOMY    . BACK SURGERY    . eye correction    . KIDNEY STONE SURGERY    . left foot surgery    . PARTIAL HYSTERECTOMY    . TONSILLECTOMY      Allergies  Allergen Reactions  . Fentanyl Anaphylaxis  . Codeine   . Corn Syrup [Glucose]     High Fructose Corn Syrup   . Latex   . Prevacid [Lansoprazole]     Hair fell out  . Thimerosol     Immunization History  Administered Date(s) Administered  . Pneumococcal Conjugate-13 05/07/2015  . Pneumococcal Polysaccharide-23 06/15/2017  . Td 03/29/2012    Family History  Problem Relation Age of Onset  . Heart disease Father      Current Outpatient Medications:  .   Cholecalciferol (VITAMIN D3) 2000 UNITS TABS, Take 1 tablet by mouth daily., Disp: , Rfl:  .  Cyanocobalamin (VITAMIN B 12 PO), Take 1,000 mcg by mouth daily. , Disp: , Rfl:  .  Melatonin 1 MG TABS, Take 1 mg by mouth at bedtime., Disp: , Rfl:  .  sodium chloride (OCEAN) 0.65 % SOLN nasal spray, Place 2 sprays into both nostrils as needed for congestion., Disp: , Rfl:  .  zolpidem (AMBIEN) 10 MG tablet, Take 1 tablet (10 mg total) by mouth at bedtime as needed for sleep. (Patient taking differently: Take 5 mg by mouth at bedtime as needed for sleep. ), Disp: 30 tablet, Rfl: 5      Objective:   Vitals:   07/27/18 1011  BP: 128/76  Pulse: 82  SpO2: 97%  Weight: 141 lb 6.4 oz (64.1 kg)  Height: 5\' 3"  (1.6 m)    Estimated body mass index is 25.05 kg/m as calculated from the following:   Height as of this encounter: 5\' 3"  (1.6 m).   Weight as of this encounter: 141 lb 6.4 oz (64.1 kg).  @WEIGHTCHANGE @  [employer]   07/27/18 1011  Weight: 141 lb 6.4 oz (64.1 kg)     Physical Exam  General Appearance:    Alert, cooperative, no distress, appears stated age - yes , Deconditioned looking - no , OBESE  - no, Sitting on Wheelchair -  no  Head:    Normocephalic, without obvious abnormality, atraumatic  Eyes:    PERRL, conjunctiva/corneas clear,  Ears:    Normal TM's and external ear canals, both ears  Nose:   Nares normal, septum midline, mucosa normal, no drainage    or sinus tenderness. OXYGEN ON  - no . Patient is @ ra   Throat:   Lips, mucosa, and tongue normal; teeth and gums normal. Cyanosis on lips - no  Neck:   Supple, symmetrical, trachea midline, no adenopathy;    thyroid:  no enlargement/tenderness/nodules; no carotid   bruit or JVD  Back:     Symmetric, no curvature, ROM normal, no CVA tenderness  Lungs:     Distress - no , Wheeze no, Barrell Chest - no, Purse lip breathing - no, Crackles - no   Chest Wall:    No tenderness or deformity.    Heart:    Regular rate and  rhythm, S1 and S2 normal, no rub   or gallop, Murmur - no  Breast Exam:    NOT DONE  Abdomen:     Soft, non-tender, bowel sounds active  all four quadrants,    no masses, no organomegaly. Visceral obesity - no  Genitalia:   NOT DONE  Rectal:   NOT DONE  Extremities:   Extremities - normal, Has Cane - no, Clubbing - no, Edema - no  Pulses:   2+ and symmetric all extremities  Skin:   Stigmata of Connective Tissue Disease - no  Lymph nodes:   Cervical, supraclavicular, and axillary nodes normal  Psychiatric:  Neurologic:   Pleasant - yes, Anxious - no, Flat affect - noio  CAm-ICU - neg, Alert and Oriented x 3 - yes, Moves all 4s - yes, Speech - normal, Cognition - intact           Assessment:       ICD-10-CM   1. History of chemical exposure [id number] Pulmonary function test  2. Chest tightness R07.89 Nitric oxide    Pulmonary function test  3. Shortness of breath R06.02 Nitric oxide    Pulmonary function test       Plan:     Patient Instructions     ICD-10-CM   1. History of chemical exposure Z87.898   2. Chest tightness R07.89 Nitric oxide  3. 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 Kalman Shan, M.D., F.C.C.P,  Pulmonary and Critical Care Medicine Staff Physician, [facility] Director - Interstitial Lung Disease  Program  Pulmonary Fibrosis [facility] Network at [facility] [place], [place], [address]  Pager: [phone], If no answer or between  15:00h - 7:00h: call [phone] Telephone: [phone]  10:14 AM 07/27/2018

## 2018-07-27 NOTE — Progress Notes (Signed)
   Subjective:    Patient ID: Brianna Carpenter, female    DOB: 25-Nov-1950, 67 y.o.   MRN: [id number]  HPI    Review of Systems  Constitutional: Positive for unexpected weight change.  HENT: Positive for ear discharge, ear pain, postnasal drip and sore throat. Negative for congestion, dental problem, nosebleeds, rhinorrhea, sinus pressure, sneezing and trouble swallowing.   Respiratory: Positive for cough and chest tightness. Negative for wheezing.   Cardiovascular: Negative for palpitations and leg swelling.  Gastrointestinal: Negative.  Negative for nausea and vomiting.  Genitourinary: Negative for dysuria.  Skin: Negative for rash.  Allergic/Immunologic: Positive for environmental allergies. Negative for food allergies and immunocompromised state.  Hematological: Does not bruise/bleed easily.  Psychiatric/Behavioral: Negative for dysphoric mood.       Objective:   Physical Exam        Assessment & Plan:

## 2018-08-02 NOTE — Telephone Encounter (Signed)
Hi, Dr. Marchelle Gearing,  I just received my Nitric Oxide test results. What does a level of 26 mean?  All best,  Olia   MR please advise on pt's email.  Thanks!

## 2018-08-03 NOTE — Telephone Encounter (Signed)
Sorry if I did not make it clear. It is a test for allergic asthma. Normal is </= 25. Abnormal is >/= 50. Yours being 26; I would call it normal

## 2018-08-03 NOTE — Telephone Encounter (Signed)
Relayed information to patient. Nothing further needed at this time.

## 2018-08-24 ENCOUNTER — Telehealth: Payer: Self-pay | Admitting: Internal Medicine

## 2018-08-24 NOTE — Telephone Encounter (Signed)
Attempted to call pt but unable to reach her. Left a message for pt letting her know that we would give the letter to MR for him to view but stated to her in the message if she needed anything from [place] before we called her back in regards to this, to call our office.  I have received the letter from [facility] mailbox that I will give him to review and let us know what we need to do next in regards to this.  Routing this encounter to MR.

## 2018-08-27 NOTE — Telephone Encounter (Signed)
The letter from pt was given to MR Wednesday, 08/25/18. MR, please advise if you have an update on this for pt. Thanks!

## 2018-08-30 NOTE — Telephone Encounter (Signed)
I have spoke to  Marisue Ivan she is already handling this matter at this time. I have also called and let the patient know this is being handle and nothing further is need at this time.

## 2018-08-30 NOTE — Telephone Encounter (Signed)
[place]  I am in Pod B. The visit 07/27/18 sshould have been a consult. My dx I used was chemical exposure, chest tightness and dyspnea. So, not sure how this went as cardiac arrest and why medicare would deny it. Could you please ask Budd Palmer to take a look. Please take the form. If I did something wrong must have been accidental click of button; I apologize. Like [place] to evaluate. Please take the form and give to him   Thanks    SIGNATURE    Dr. Kalman Shan, M.D., F.C.C.P,  Pulmonary and Critical Care Medicine Staff Physician, [facility] Director - Interstitial Lung Disease  Program  Pulmonary Fibrosis [facility] Network at [facility] [place], [place], [address]  Pager: [phone], If no answer or between  15:00h - 7:00h: call [phone] Telephone: [phone]  9:04 AM 08/30/2018

## 2018-09-08 ENCOUNTER — Telehealth: Payer: Self-pay | Admitting: Internal Medicine

## 2018-09-08 ENCOUNTER — Encounter: Payer: Medicare Other | Admitting: Internal Medicine

## 2018-09-08 ENCOUNTER — Ambulatory Visit (INDEPENDENT_AMBULATORY_CARE_PROVIDER_SITE_OTHER): Payer: Medicare Other | Admitting: Internal Medicine

## 2018-09-08 DIAGNOSIS — R0789 Other chest pain: Secondary | ICD-10-CM

## 2018-09-08 DIAGNOSIS — Z87898 Personal history of other specified conditions: Secondary | ICD-10-CM

## 2018-09-08 DIAGNOSIS — R0602 Shortness of breath: Secondary | ICD-10-CM

## 2018-09-08 LAB — PULMONARY FUNCTION TEST
DL/VA % pred: 108 %
DL/VA: 5.08 ml/min/mmHg/L
DLCO UNC % PRED: 92 %
DLCO unc: 21.09 ml/min/mmHg
FEF 25-75 PRE: 3.22 L/s
FEF2575-%PRED-PRE: 170 %
FEV1-%PRED-PRE: 110 %
FEV1-PRE: 2.44 L
FEV1FVC-%Pred-Pre: 111 %
FEV6-%PRED-PRE: 103 %
FEV6-Pre: 2.88 L
FEV6FVC-%PRED-PRE: 104 %
FVC-%Pred-Pre: 98 %
FVC-Pre: 2.88 L
Pre FEV1/FVC ratio: 85 %
Pre FEV6/FVC Ratio: 100 %
RV % PRED: 118 %
RV: 2.52 L
TLC % pred: 108 %
TLC: 5.31 L

## 2018-09-08 NOTE — Progress Notes (Signed)
PFT completed today.  

## 2018-09-08 NOTE — Telephone Encounter (Signed)
Brianna Carpenter  Patient had PFT and was supposed to see me but could not see me because I was running late. Let her know PFT is normal. So if she still is dyspneic,then need CPST with EIB and she can come in to discuss those results  You can explain to her CPST  Thanks  .  SIGNATURE    Dr. Kalman Shan, M.D., F.C.C.P,  Pulmonary and Critical Care Medicine Staff Physician, [facility] Director - Interstitial Lung Disease  Program  Pulmonary Fibrosis [facility] at [facility] [place], [place], [address]  Pager: [phone], If no answer or between  15:00h - 7:00h: call [phone] Telephone: [phone]  2:00 PM 09/08/2018

## 2018-09-09 NOTE — Telephone Encounter (Signed)
Attempted to call pt but line went straight to VM. Left message for pt to return call. 

## 2018-09-09 NOTE — Telephone Encounter (Signed)
Pt is calling back [phone]

## 2018-09-09 NOTE — Telephone Encounter (Signed)
Called and spoke with patient, she is aware and verbalized understanding. She does not want to have further testing at this time.

## 2018-09-14 NOTE — Progress Notes (Signed)
@Patient  ID: Gracelyn Nurse, female    DOB: 1951/04/18, 67 y.o.   MRN: [id number]  Chief Complaint  Patient presents with  . 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  But she has never smoked.  PMH:  Smoker/ Smoking History: Never smoker Maintenance: None Pt of: Dr. Marchelle Gearing  09/15/2018  - Visit   67 year old female patient presenting today to review pulmonary function test results.  Patient was suspected reactive airway disease due to chemical exposure 3 months ago.  Patient also had a chemical exposure 8 years ago which caused 6 years of symptoms.  Patient reports that symptoms from 3 months ago have slowly started to resolve and now she is only having episodes of shortness of breath at night.  Patient reports that she feels that things have been going better since last office visit.  Patient reports that symptoms worsened acutely after pulmonary function test for 24 hours but those have since resolved.  Unfortunately patient refused to use albuterol or Xopenex during the pulmonary function test so we do not have a full PFT as originally intended.  09/08/2018-pulmonary function test-FVC 2.88 (98% predicted), ratio 85, FEV1 110, DLCO 92, no postbronchodilator was performed as patient refused to take albuterol or Xopenex  Patient with multiple allergies and patient is resistant to taking many medications.  Patient reports she does not want to take inhalers as when she took an antibiotic she lost most of her hair and took 6 months to recover from the symptoms.   Tests:  09/08/2018-pulmonary function test-FVC 2.88 (98% predicted), ratio 85, FEV1 110, DLCO 92, no postbronchodilator was performed as patient refused to take albuterol or Xopenex  FENO:  Lab  Results  Component Value Date   NITRICOXIDE 26 07/27/2018    PFT: PFT Results Latest Ref Rng & Units 09/08/2018  FVC-Pre L 2.88  FVC-Predicted Pre % 98  Pre FEV1/FVC % % 85  FEV1-Pre L 2.44  FEV1-Predicted Pre % 110  DLCO UNC% % 92  DLCO COR %Predicted % 108  TLC L 5.31  TLC % Predicted % 108  RV % Predicted % 118    Imaging: No results found.    Specialty Problems      Pulmonary Problems   RAD (reactive airway disease)    2011 d/t Borax exposure  August/2019 - Borax exposure          Allergies  Allergen Reactions  . Fentanyl Anaphylaxis  . Codeine   . Corn Syrup [Glucose]     High Fructose Corn Syrup   . Latex   . Prevacid [Lansoprazole]     Hair fell out  . Thimerosol     Immunization History  Administered Date(s) Administered  . Pneumococcal Conjugate-13 05/07/2015  . Pneumococcal Polysaccharide-23 06/15/2017  . Pneumococcal-Unspecified 06/15/2017  . Td 03/29/2012   >>>  Cannot take flu vaccine   Past Medical History:  Diagnosis Date  . Arthritis    Feet  . Chronic neck pain   . Hypertension   . Hypertensive retinopathy   . Insomnia   . Plantar fasciitis   . TMJ (temporomandibular joint syndrome)   . Vertigo     Tobacco History: Social History   Tobacco Use  Smoking Status  Never Smoker  Smokeless Tobacco Never Used   Counseling given: Yes  Continue to not smoke   Outpatient Encounter Medications as of 09/15/2018  Medication Sig  . Cholecalciferol (VITAMIN D3) 2000 UNITS TABS Take 1 tablet by mouth daily.  . Cyanocobalamin (VITAMIN B 12 PO) Take 1,000 mcg by mouth daily.   . Melatonin 1 MG TABS Take 1 mg by mouth at bedtime.  Marland Kitchen OVER THE COUNTER MEDICATION Take 100 mg by mouth at bedtime.  . sodium chloride (OCEAN) 0.65 % SOLN nasal spray Place 2 sprays into both nostrils as needed for congestion.  Marland Kitchen zolpidem (AMBIEN) 10 MG tablet Take 1 tablet (10 mg total) by mouth at bedtime as needed for sleep. (Patient taking differently:  Take 5 mg by mouth at bedtime as needed for sleep. )   No facility-administered encounter medications on file as of 09/15/2018.      Review of Systems  Review of Systems  Constitutional: Positive for fatigue. Negative for chills, fever and unexpected weight change.  HENT: Positive for postnasal drip. Negative for congestion, ear pain, sinus pressure and sinus pain.   Respiratory: Positive for cough (cough at night ). Negative for chest tightness, shortness of breath and wheezing.   Cardiovascular: Negative for chest pain and palpitations.  Genitourinary: Negative for dysuria, frequency and urgency.  Musculoskeletal: Negative for arthralgias.  Skin: Negative for color change.  Allergic/Immunologic: Negative for environmental allergies and food allergies.  Neurological: Negative for dizziness, light-headedness and headaches.  Psychiatric/Behavioral: Negative for dysphoric mood. The patient is not nervous/anxious.   All other systems reviewed and are negative.    Physical Exam  BP (!) 142/80 (BP Location: Left Arm, Cuff Size: Normal)   Pulse 72   Ht 5\' 4"  (1.626 m)   Wt 142 lb (64.4 kg)   SpO2 99%   BMI 24.37 kg/m   Wt Readings from Last 5 Encounters:  09/15/18 142 lb (64.4 kg)  07/27/18 141 lb 6.4 oz (64.1 kg)  01/21/18 139 lb 8 oz (63.3 kg)  12/14/17 139 lb (63 kg)  10/06/17 145 lb (65.8 kg)    Physical Exam  Constitutional: She is oriented to person, place, and time and well-developed, well-nourished, and in no distress. No distress.  HENT:  Head: Normocephalic and atraumatic.  Right Ear: Hearing, tympanic membrane, external ear and ear canal normal.  Left Ear: Hearing, tympanic membrane, external ear and ear canal normal.  Mouth/Throat: Uvula is midline and oropharynx is clear and moist. No oropharyngeal exudate.  Eyes: Pupils are equal, round, and reactive to light.  Neck: Normal range of motion. Neck supple. No JVD present.  Cardiovascular: Normal rate, regular  rhythm and normal heart sounds.  Pulmonary/Chest: Effort normal and breath sounds normal. No accessory muscle usage. No respiratory distress. She has no decreased breath sounds. She has no wheezes. She has no rhonchi.  Musculoskeletal: Normal range of motion.        General: No edema.  Lymphadenopathy:    She has no cervical adenopathy.  Neurological: She is alert and oriented to person, place, and time. Gait normal.  Skin: Skin is warm and dry. She is not diaphoretic. No erythema.  Psychiatric: Mood, memory, affect and judgment normal.  Nursing note and vitals reviewed.    Lab Results:  CBC    Component Value Date/Time   WBC 6.4 12/14/2017 1301   RBC 4.30 12/14/2017 1301   HGB 13.1 12/14/2017 1301   HCT 39.4 12/14/2017 1301   PLT 300 12/14/2017 1301  MCV 91.6 12/14/2017 1301   MCV 96.3 08/26/2013 1205   MCH 30.5 12/14/2017 1301   MCHC 33.2 12/14/2017 1301   RDW 13.3 12/14/2017 1301   LYMPHSABS 2.0 02/24/2011 1426   MONOABS 0.4 02/24/2011 1426   EOSABS 0.1 02/24/2011 1426   BASOSABS 0.0 02/24/2011 1426    BMET    Component Value Date/Time   NA 138 12/14/2017 1301   K 4.0 12/14/2017 1301   CL 102 12/14/2017 1301   CO2 24 12/14/2017 1301   GLUCOSE 103 (H) 12/14/2017 1301   BUN 12 12/14/2017 1301   CREATININE 0.69 12/14/2017 1301   CREATININE 0.72 05/05/2014 0820   CALCIUM 9.5 12/14/2017 1301   GFRNONAA >60 12/14/2017 1301   GFRAA >60 12/14/2017 1301    BNP No results found for: BNP  ProBNP No results found for: PROBNP    Assessment & Plan:   67 year old female patient presenting today for follow-up visit to discuss pulmonary function test results I have reviewed these with the patient.  Patient symptoms are resolving she does not want to proceed further with further testing at this time.  I believe that makes sense as I do not see how that would change our plan of care as patient is currently refusing using any inhalers at this time.  Patient to follow-up  with Dr. Marchelle Gearing in 6 months or sooner if symptoms do not improve  Alpha-1-antitrypsin deficiency carrier Cannot take flu vaccine   Follow-up with Dr. Marchelle Gearing in 6 months or sooner if symptoms are not improving  Follow-up with primary care regarding hepatitis vaccines   RAD (reactive airway disease) We offered a cardiopulmonary stress test with EIB today you declined as you would not like to proceed forward with inhaler therapies and symptoms are improving >>>If symptoms stabilize or worsen and you would like to proceed forward with this testing please contact our office and we can get that process started for you  Follow-up with Dr. Marchelle Gearing in 6 months or sooner if symptoms are not improving       Coral Ceo, NP 09/15/2018   This appointment was 32 min long with over 50% of the time in direct face-to-face patient care, assessment, plan of care, and follow-up.

## 2018-09-15 ENCOUNTER — Ambulatory Visit (INDEPENDENT_AMBULATORY_CARE_PROVIDER_SITE_OTHER): Payer: Medicare Other | Admitting: Pulmonary Disease

## 2018-09-15 ENCOUNTER — Encounter: Payer: Self-pay | Admitting: Pulmonary Disease

## 2018-09-15 DIAGNOSIS — J45909 Unspecified asthma, uncomplicated: Secondary | ICD-10-CM | POA: Insufficient documentation

## 2018-09-15 DIAGNOSIS — Z148 Genetic carrier of other disease: Secondary | ICD-10-CM

## 2018-09-15 NOTE — Assessment & Plan Note (Signed)
We offered a cardiopulmonary stress test with EIB today you declined as you would not like to proceed forward with inhaler therapies and symptoms are improving >>>If symptoms stabilize or worsen and you would like to proceed forward with this testing please contact our office and we can get that process started for you  Follow-up with Dr. Marchelle Gearing in 6 months or sooner if symptoms are not improving

## 2018-09-15 NOTE — Patient Instructions (Signed)
We offered a cardiopulmonary stress test with EIB today you declined as you would not like to proceed forward with inhaler therapies and symptoms are improving >>>If symptoms stabilize or worsen and you would like to proceed forward with this testing please contact our office and we can get that process started for you  Follow-up with Dr. Marchelle Gearing in 6 months or sooner if symptoms are not improving  Follow-up with primary care regarding hepatitis vaccines    It is flu season:   >>>Remember to be washing your hands regularly, using hand sanitizer, be careful to use around herself with has contact with people who are sick will increase her chances of getting sick yourself. >>> Best ways to protect herself from the flu: Receive the yearly flu vaccine, practice good hand hygiene washing with soap and also using hand sanitizer when available, eat a nutritious meals, get adequate rest, hydrate appropriately   Please contact the office if your symptoms worsen or you have concerns that you are not improving.   Thank you for choosing [facility] for your healthcare, and for allowing [place] to partner with you on your healthcare journey. I am thankful to be able to provide care to you today.   Elisha Headland FNP-C

## 2018-09-15 NOTE — Assessment & Plan Note (Signed)
Cannot take flu vaccine   Follow-up with Dr. Marchelle Gearing in 6 months or sooner if symptoms are not improving  Follow-up with primary care regarding hepatitis vaccines

## 2018-10-12 ENCOUNTER — Ambulatory Visit: Payer: Medicare Other | Admitting: Internal Medicine

## 2018-11-02 ENCOUNTER — Encounter: Payer: Self-pay | Admitting: Internal Medicine

## 2018-11-02 ENCOUNTER — Ambulatory Visit (INDEPENDENT_AMBULATORY_CARE_PROVIDER_SITE_OTHER): Payer: Medicare Other | Admitting: Internal Medicine

## 2018-11-02 VITALS — BP 118/78 | HR 65 | Ht 64.0 in | Wt 144.6 lb

## 2018-11-02 DIAGNOSIS — Z148 Genetic carrier of other disease: Secondary | ICD-10-CM

## 2018-11-02 DIAGNOSIS — R0602 Shortness of breath: Secondary | ICD-10-CM | POA: Diagnosis not present

## 2018-11-02 NOTE — Progress Notes (Signed)
OV 07/27/2018  Subjective:  Patient ID: Brianna Carpenter, female , DOB: Mar 03, 1951 , age 68 y.o. , MRN: [id number] , ADDRESS: [address] [name] [address]   07/27/2018 -   Chief Complaint  Patient presents with  . 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      HPI Brianna Carpenter 68 y.o. -female here for a new consult.  Self-referred.  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  But she has never smoked.  She does not have any biologic kids.  She tells me that approximately 8 years ago while at home she inhaled some bleach which was left open and after the suffered from shortness of breath and chest tightness that slowly work itself out over the next few to several years.  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  There was a spill of this powder.  Apparently this got sucked into her duct system.  And then 2 days later he injected out off the duct system and she started inhaling the chemicals.  Soon after she started noticing shortness of breath, chest tightness and even eye  burned and also nasal irritation.  A day later she figured this was because of the Borax.  She then checked herself into a hotel for 2 days.  She then returned home in the home still continue to smell.  She also had smell and cough.  She got cleaning system and had a house remediated.  She says the house is now almost free of smell.  She is able to live in the house now.  However she is continues to have shortness of breath with exertion and chest tightness but not much of a cough     she does not have any previous history of asthma or smoking history.  She feels her symptoms are improved since it started.  Symptoms are rated as  moderate.  There is no chest pain or diaphoresis.  She saw Brianna Carpenter in [place].  She did a pulmonary function test with him on June 28, 2018 but I visualized these results and it is a poor technical quality and uninterpretable.  She had a chest x-ray with him that is clear.  I do not have this image with me.  She also tells me that in general medications do not agree with her because of numerous allergies.  Therefore she wants a very simplistic line of treatment.  Today we did exam nitric oxide was normal at 26.  We did walking desaturation test on 85 feet x 3 laps on room air: Resting pulse ox 99%.  Resting heart rate 72/min.  Final pulse ox 97% and final heart rate 99/min.  Heart tachycardic but otherwise normal.    Results for Brianna, Carpenter (MRN [id number]) as of 07/27/2018 09:47  Ref. Range 06/20/2009 23:59 02/24/2011 14:26  Eosinophils Absolute Latest Ref Range: 0.0 - 0.7 K/uL 0.0 0.1     09/15/2018  - Visit   68 year old female patient presenting today to review pulmonary function test results.  Patient was suspected reactive airway disease due to chemical exposure 3 months ago.  Patient also had a chemical  exposure 8 years ago which caused 6 years of symptoms.  Patient reports that symptoms from 3 months ago have slowly started to resolve and now she is only having episodes of shortness of breath at night.  Patient reports that she feels that things have been going better since last office visit.  Patient reports that symptoms worsened acutely after pulmonary function test for 24 hours but those have since resolved.  Unfortunately patient refused to use albuterol or Xopenex during the pulmonary function test so we do not have a full PFT as originally intended.  09/08/2018-pulmonary function test-FVC 2.88 (98% predicted), ratio 85, FEV1 110, DLCO 92, no postbronchodilator was performed as patient refused to take albuterol or Xopenex  Patient with multiple allergies and patient is  resistant to taking many medications.  Patient reports she does not want to take inhalers as when she took an antibiotic she lost most of her hair and took 6 months to recover from the symptoms.   Tests:  09/08/2018-pulmonary function test-FVC 2.88 (98% predicted), ratio 85, FEV1 110, DLCO 92, no postbronchodilator was performed as patient refused to take albuterol or Xopenex    OV 11/02/2018  Subjective:  Patient ID: Brianna Carpenter, female , DOB: Jun 30, 1951 , age 1 y.o. , MRN: [id number] , ADDRESS: [address] [name] [address]   11/02/2018 -   Chief Complaint  Patient presents with  . Follow-up    Pt states she is doing better since last visit after the exposure to chemicals. States she still has some postnasal drip but denies any complaints of cough, SOB, or chest tightness.     HPI Brianna Carpenter 68 y.o. -returns for follow-up.  At this point in time she says she is completely improved with the shortness of breath.  Denies any shortness of breath cough, chest tightness, weakness, wheezing, orthopnea, proximal nocturnal dyspnea.  The only thing she has is a chronic postnasal drip.  She is more worried about her being an alpha-1 antitrypsin carrier.  This was discovered incidentally in a commercial genetic test.  She is asking about serial monitoring of lung function.  She is not a smoker.  Most recent lung function was normal.  She is asking for local primary care support since her primary care physician is left private practice.  I have recommended [facility] or Dr Brianna Carpenter of Brianna [facility]     ROS - per HPI     has a past medical history of Arthritis, Chronic neck pain, Hypertension, Hypertensive retinopathy, Insomnia, Plantar fasciitis, TMJ (temporomandibular joint syndrome), and Vertigo.   reports that she has never smoked. She has never used smokeless tobacco.  Past Surgical History:  Procedure Laterality Date  . ABDOMINAL HYSTERECTOMY    . ADENOIDECTOMY    . BACK  SURGERY    . eye correction    . KIDNEY STONE SURGERY    . left foot surgery    . PARTIAL HYSTERECTOMY    . TONSILLECTOMY      Allergies  Allergen Reactions  . Fentanyl Anaphylaxis  . Augmentin [Amoxicillin-Pot Clavulanate]     Severe vertigo and dizziness  . Azithromycin     Irregular heartbeat  . Codeine   . Corn Syrup [Glucose]     High Fructose Corn Syrup   . Latex   . Prevacid [Lansoprazole]     Hair fell out  . Thimerosol     Immunization History  Administered Date(s) Administered  . Pneumococcal Conjugate-13 05/07/2015  . Pneumococcal Polysaccharide-23  06/15/2017  . Pneumococcal-Unspecified 06/15/2017  . Td 03/29/2012    Family History  Problem Relation Age of Onset  . Heart disease Father      Current Outpatient Medications:  .  Cholecalciferol (VITAMIN D3) 2000 UNITS TABS, Take 1 tablet by mouth daily., Disp: , Rfl:  .  Cyanocobalamin (VITAMIN B 12 PO), Take 1,000 mcg by mouth daily. , Disp: , Rfl:  .  OVER THE COUNTER MEDICATION, Take 100 mg by mouth at bedtime., Disp: , Rfl:  .  sodium chloride (OCEAN) 0.65 % SOLN nasal spray, Place 2 sprays into both nostrils as needed for congestion., Disp: , Rfl:  .  zolpidem (AMBIEN) 10 MG tablet, Take 1 tablet (10 mg total) by mouth at bedtime as needed for sleep. (Patient taking differently: Take 5 mg by mouth at bedtime as needed for sleep. ), Disp: 30 tablet, Rfl: 5      Objective:   Vitals:   11/02/18 0931  BP: 118/78  Pulse: 65  SpO2: 99%  Weight: 144 lb 9.6 oz (65.6 kg)  Height: 5\' 4"  (1.626 m)    Estimated body mass index is 24.82 kg/m as calculated from the following:   Height as of this encounter: 5\' 4"  (1.626 m).   Weight as of this encounter: 144 lb 9.6 oz (65.6 kg).  @WEIGHTCHANGE @  [employer]ights   11/02/18 0931  Weight: 144 lb 9.6 oz (65.6 kg)     Physical Exam   General Appearance:    Alert, cooperative, no distress, appears stated age - tes , Deconditioned looking -no , OBESE  -  no, Sitting on Wheelchair -  no  Head:    Normocephalic, without obvious abnormality, atraumatic  Eyes:    PERRL, conjunctiva/corneas clear,  Ears:    Normal TM's and external ear canals, both ears  Nose:   Nares normal, septum midline, mucosa normal, no drainage    or sinus tenderness. OXYGEN ON  - no . Patient is @ ra   Throat:   Lips, mucosa, and tongue normal; teeth and gums normal. Cyanosis on lips - ni  Neck:   Supple, symmetrical, trachea midline, no adenopathy;    thyroid:  no enlargement/tenderness/nodules; no carotid   bruit or JVD  Back:     Symmetric, no curvature, ROM normal, no CVA tenderness  Lungs:     Distress - no , Wheeze no, Barrell Chest - no, Purse lip breathing - no, Crackles - no   Chest Wall:    No tenderness or deformity.    Heart:    Regular rate and rhythm, S1 and S2 normal, no rub   or gallop, Murmur - n0  Breast Exam:    NOT DONE  Abdomen:     Soft, non-tender, bowel sounds active all four quadrants,    no masses, no organomegaly. Visceral obesity - no  Genitalia:   NOT DONE  Rectal:   NOT DONE  Extremities:   Extremities - normal, Has Cane - no, Clubbing - no, Edema - no  Pulses:   2+ and symmetric all extremities  Skin:   Stigmata of Connective Tissue Disease - no  Lymph nodes:   Cervical, supraclavicular, and axillary nodes normal  Psychiatric:  Neurologic:   Pleasant - yes, Anxious - no, Flat affect - no  CAm-ICU - neg, Alert and Oriented x 3 - yes, Moves all 4s - yes, Speech - normal, Cognition - intact           Assessment:  ICD-10-CM   1. Alpha-1-antitrypsin deficiency carrier Z14.8 Pulmonary function test  2. Shortness of breath R06.02        Plan:     Patient Instructions  Alpha-1-antitrypsin deficiency carrier  - do spirometry/dlco in feb 2021 - return in feb 2021 for review and visit  Shortness of breath - glad is resolved   Followup 1 year but after spirometry/dlco    (> 50% of this 15 min visit spent in face to  face counseling or/and coordination of care by this undersigned MD - Dr Kalman Shan. This includes one or more of the following documented above: discussion of test results, diagnostic or treatment recommendations, prognosis, risks and benefits of management options, instructions, education, compliance or risk-factor reduction)    SIGNATURE    Dr. Kalman Shan, M.D., F.C.C.P,  Pulmonary and Critical Care Medicine Staff Physician, [facility] Director - Interstitial Lung Disease  Program  Pulmonary Fibrosis [facility] at [facility] [place], [place], [address]  Pager: [phone], If no answer or between  15:00h - 7:00h: call [phone] Telephone: [phone]  9:54 AM 11/02/2018

## 2018-11-02 NOTE — Patient Instructions (Signed)
Alpha-1-antitrypsin deficiency carrier  - do spirometry/dlco in feb 2021 - return in feb 2021 for review and visit  Shortness of breath - glad is resolved   Followup 1 year but after spirometry/dlco

## 2019-10-22 ENCOUNTER — Ambulatory Visit: Payer: Medicare Other | Attending: Internal Medicine

## 2019-10-22 DIAGNOSIS — Z23 Encounter for immunization: Secondary | ICD-10-CM

## 2019-10-22 NOTE — Progress Notes (Signed)
   Covid-19 Vaccination Clinic  Name:  Brianna Carpenter    MRN: [id number] DOB: 04-24-1951  10/22/2019  [name] was observed post Covid-19 immunization for 15 minutes without incidence. She was provided with Vaccine Information Sheet and instruction to access the V-Safe system.   [name] was instructed to call 911 with any severe reactions post vaccine: Marland Kitchen Difficulty breathing  . Swelling of your face and throat  . A fast heartbeat  . A bad rash all over your body  . Dizziness and weakness    Immunizations Administered    Name Date Dose VIS Date Route   Pfizer COVID-19 Vaccine 10/22/2019 11:43 AM 0.3 mL 09/09/2019 Intramuscular   Manufacturer: [employer], [employer]   Lot: [id number]   NDC: [address]-9

## 2019-11-12 ENCOUNTER — Ambulatory Visit: Payer: Medicare Other | Attending: Internal Medicine

## 2019-11-12 DIAGNOSIS — Z23 Encounter for immunization: Secondary | ICD-10-CM

## 2019-11-12 NOTE — Progress Notes (Signed)
   Covid-19 Vaccination Clinic  Name:  Brianna Carpenter    MRN: [id number] DOB: [birth year]  11/12/2019  [name] was observed post Covid-19 immunization for 30 minutes based on pre-vaccination screening without incidence. She was provided with Vaccine Information Sheet and instruction to access the V-Safe system.   [name] was instructed to call 911 with any severe reactions post vaccine: Marland Kitchen Difficulty breathing  . Swelling of your face and throat  . A fast heartbeat  . A bad rash all over your body  . Dizziness and weakness    Immunizations Administered    Name Date Dose VIS Date Route   Pfizer COVID-19 Vaccine 11/12/2019 10:01 AM 0.3 mL 09/09/2019 Intramuscular   Manufacturer: [employer], [employer]   Lot: [id number]   NDC: [address]-3

## 2019-11-19 ENCOUNTER — Ambulatory Visit: Payer: Medicare Other

## 2020-07-03 ENCOUNTER — Ambulatory Visit: Payer: Medicare Other | Attending: Internal Medicine

## 2020-07-03 DIAGNOSIS — Z23 Encounter for immunization: Secondary | ICD-10-CM

## 2020-07-03 NOTE — Progress Notes (Signed)
   Covid-19 Vaccination Clinic  Name:  Brianna Carpenter    MRN: [id number] DOB: 1950-11-19  07/03/2020  Brianna Carpenter was observed post Covid-19 immunization for 30 minutes based on pre-vaccination screening without incident. She was provided with Vaccine Information Sheet and instruction to access the V-Safe system.   Brianna Carpenter was instructed to call 911 with any severe reactions post vaccine: Marland Kitchen Difficulty breathing  . Swelling of face and throat  . A fast heartbeat  . A bad rash all over body  . Dizziness and weakness

## 2021-04-23 ENCOUNTER — Encounter: Payer: Self-pay | Admitting: Gastroenterology

## 2021-06-26 ENCOUNTER — Other Ambulatory Visit (HOSPITAL_BASED_OUTPATIENT_CLINIC_OR_DEPARTMENT_OTHER): Payer: Self-pay

## 2021-06-26 ENCOUNTER — Ambulatory Visit: Payer: Medicare Other | Attending: Internal Medicine

## 2021-06-26 DIAGNOSIS — Z23 Encounter for immunization: Secondary | ICD-10-CM

## 2021-06-26 MED ORDER — PFIZER COVID-19 VAC BIVALENT 30 MCG/0.3ML IM SUSP
INTRAMUSCULAR | 0 refills | Status: DC
  Filled 2021-06-26: qty 0.3, 1d supply, fill #0

## 2021-06-26 NOTE — Progress Notes (Signed)
   Covid-19 Vaccination Clinic  Name:  Brianna Carpenter    MRN: [id number] DOB: 07/30/1951  06/26/2021  Brianna Carpenter was observed post Covid-19 immunization for 15 minutes without incident. She was provided with Vaccine Information Sheet and instruction to access the V-Safe system.   Brianna Carpenter was instructed to call 911 with any severe reactions post vaccine: Difficulty breathing  Swelling of face and throat  A fast heartbeat  A bad rash all over body  Dizziness and weakness

## 2021-07-04 ENCOUNTER — Ambulatory Visit (INDEPENDENT_AMBULATORY_CARE_PROVIDER_SITE_OTHER): Payer: Medicare Other | Admitting: Gastroenterology

## 2021-07-04 ENCOUNTER — Encounter: Payer: Self-pay | Admitting: Gastroenterology

## 2021-07-04 ENCOUNTER — Other Ambulatory Visit: Payer: Medicare Other

## 2021-07-04 VITALS — BP 154/76 | HR 66 | Ht 64.0 in | Wt 138.0 lb

## 2021-07-04 DIAGNOSIS — E8801 Alpha-1-antitrypsin deficiency: Secondary | ICD-10-CM

## 2021-07-04 DIAGNOSIS — Z148 Genetic carrier of other disease: Secondary | ICD-10-CM

## 2021-07-04 DIAGNOSIS — R1011 Right upper quadrant pain: Secondary | ICD-10-CM

## 2021-07-04 DIAGNOSIS — K529 Noninfective gastroenteritis and colitis, unspecified: Secondary | ICD-10-CM | POA: Diagnosis not present

## 2021-07-04 NOTE — Progress Notes (Signed)
Brianna Carpenter    [id number]    1951/06/27  Primary Care Physician:Patient, No Pcp Per (Inactive)  Referring Physician: No referring provider defined for this encounter.   Chief complaint: Chronic diarrhea  HPI: 70 year old very pleasant female here for new patient visit with complaints of chronic diarrhea She has been experiencing diarrhea for past 6 years, on average she has 4-6 bowel movements per day with occasional nocturnal symptoms. She has tried over-the-counter Imodium and also has used Lomotil with no significant improvement.  She is currently using herbal supplements, which helps her to some extent  She has tried eliminating multiple foods, has followed low FODMAP diet with no improvement  She has intermittent right upper quadrant abdominal pain, is worried about her liver given her alpha 1 antitrypsin carrier status  Denies any rectal bleeding, melena or blood in stool.  No recent antibiotics or sick contacts.  Last colonoscopy was with Dr. Loreta Ave in April 2017, was normal per patient with no polyp removal.  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  She is trying to wean off it.  She is currently avoiding taking any medications   Outpatient Encounter Medications as of 07/04/2021  Medication Sig   COVID-19 mRNA bivalent vaccine, Pfizer, (PFIZER COVID-19 VAC BIVALENT) injection Inject into the muscle.   LORazepam (ATIVAN) 1 MG tablet Take 1 mg by mouth at bedtime.   [DISCONTINUED] Cholecalciferol (VITAMIN D3) 2000 UNITS TABS Take 1 tablet by mouth daily.   [DISCONTINUED] Cyanocobalamin (VITAMIN B 12 PO) Take 1,000 mcg by mouth daily.  (Patient not taking: Reported on 07/04/2021)   [DISCONTINUED] OVER THE COUNTER MEDICATION Take 100 mg by mouth at bedtime.   [DISCONTINUED] sodium chloride (OCEAN) 0.65 % SOLN  nasal spray Place 2 sprays into both nostrils as needed for congestion.   [DISCONTINUED] zolpidem (AMBIEN) 10 MG tablet Take 1 tablet (10 mg total) by mouth at bedtime as needed for sleep. (Patient not taking: Reported on 07/04/2021)   No facility-administered encounter medications on file as of 07/04/2021.    Allergies as of 07/04/2021 - Review Complete 11/02/2018  Allergen Reaction Noted   Fentanyl Anaphylaxis 12/22/2016   Augmentin [amoxicillin-pot clavulanate]  11/02/2018   Azithromycin  11/02/2018   Codeine  04/30/2011   Corn syrup [glucose]  02/11/2013   Latex  04/30/2011   Prevacid [lansoprazole]  02/09/2013   Thimerosol  04/30/2011    Past Medical History:  Diagnosis Date   Arthritis    Feet   Chronic neck pain    Heart murmur 2018   Hypertension    Hypertensive retinopathy    Insomnia    Kidney stone 2010   Plantar fasciitis    TMJ (temporomandibular joint syndrome)    Vertigo     Past Surgical History:  Procedure Laterality Date   ABDOMINAL HYSTERECTOMY  1990   ADENOIDECTOMY     BACK SURGERY     eye correction     KIDNEY STONE SURGERY     left foot surgery     PARTIAL HYSTERECTOMY     TONSILLECTOMY      Family History  Problem Relation Age of Onset   Heart attack Mother    Heart disease Father    Colon cancer Neg Hx    Esophageal cancer Neg Hx    Stomach cancer Neg  Hx     Social History   Socioeconomic History   Marital status: Divorced    Spouse name: Not on file   Number of children: Not on file   Years of education: Not on file   Highest education level: Not on file  Occupational History   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  Tobacco Use   Smoking status: Never   Smokeless tobacco: Never  Vaping Use   Vaping Use: Never used  Substance and Sexual Activity   Alcohol use: Yes    Comment: occ   Drug use: No   Sexual activity: Never    Birth control/protection: Abstinence  Other Topics Concern    Not on file  Social History Narrative   3x a week---  Elliptical and treadmill   Social Determinants of Health   Financial Resource Strain: Not on file  Food Insecurity: Not on file  Transportation Needs: Not on file  Physical Activity: Not on file  Stress: Not on file  Social Connections: Not on file  Intimate Partner Violence: Not on file      Review of systems: All other review of systems negative except as mentioned in the HPI.   Physical Exam: Vitals:   07/04/21 0949  BP: (!) 154/76  Pulse: 66  SpO2: 97%   Body mass index is 23.69 kg/m. Gen:      No acute distress HEENT:  sclera anicteric Abd:      soft, non-tender; no palpable masses, no distension Ext:    No edema Neuro: alert and oriented x 3 Psych: normal mood and affect  Data Reviewed:  Reviewed labs, radiology imaging, old records and pertinent past GI work up   Assessment and Plan/Recommendations:  70 year old female with history of chronic diarrhea She has alpha 1 antitrypsin gene mutation, is a carrier  Chronic diarrhea: We will check GI pathogen panel to exclude any acute infectious etiology exacerbating chronic diarrhea  Check fecal elastase, fecal fat to exclude pancreatic insufficiency Check fecal lactoferrin/calprotectin to exclude colitis  Check TTG IgA antibody to exclude celiac disease  Will consider colonoscopy with biopsies to exclude microscopic colitis if above work-up is unrevealing for any significant etiology and if she continues to have persistent diarrhea  Right upper quadrant abdominal pain: Obtain abdominal ultrasound to further evaluate, exclude any acute intra-abdominal pathology   This visit required 60 minutes of patient care (this includes precharting, chart review, review of results, face-to-face time used for counseling as well as treatment plan and follow-up. The patient was provided an opportunity to ask questions and all were answered. The patient agreed with  the plan and demonstrated an understanding of the instructions.  Iona Beard , MD    CC: No ref. provider found

## 2021-07-04 NOTE — Patient Instructions (Signed)
Your provider has requested that you go to the basement level for lab work before leaving today. Press "B" on the elevator. The lab is located at the first door on the left as you exit the elevator.   You have been scheduled for an abdominal ultrasound at [facility] (1st floor of hospital) on 07/11/2021 at 6:45am. Please arrive 15 minutes prior to your appointment for registration. Make certain not to have anything to eat or drink after midnight prior to your appointment. Should you need to reschedule your appointment, please contact radiology at [phone]. This test typically takes about 30 minutes to perform.   Due to recent changes in healthcare laws, you may see the results of your imaging and laboratory studies on MyChart before your provider has had a chance to review them.  We understand that in some cases there may be results that are confusing or concerning to you. Not all laboratory results come back in the same time frame and the provider may be waiting for multiple results in order to interpret others.  Please give [place] 48 hours in order for your provider to thoroughly review all the results before contacting the office for clarification of your results.    If you are age 31 or older, your body mass index should be between 23-30. Your Body mass index is 23.69 kg/m. If this is out of the aforementioned range listed, please consider follow up with your Primary Care Provider.  If you are age 109 or younger, your body mass index should be between 19-25. Your Body mass index is 23.69 kg/m. If this is out of the aformentioned range listed, please consider follow up with your Primary Care Provider.   __________________________________________________________  The [place] [facility] providers would like to encourage you to use [facility] to communicate with providers for non-urgent requests or questions.  Due to long hold times on the telephone, sending your provider a message by [facility] may be a  faster and more efficient way to get a response.  Please allow 48 business hours for a response.  Please remember that this is for non-urgent requests.    I appreciate the  opportunity to care for you  Thank You   Marsa Aris , MD

## 2021-07-05 ENCOUNTER — Encounter: Payer: Self-pay | Admitting: Gastroenterology

## 2021-07-05 LAB — TISSUE TRANSGLUTAMINASE, IGA: (tTG) Ab, IgA: <1 U/mL

## 2021-07-05 LAB — IGA: Immunoglobulin A: 159 mg/dL (ref 70–320)

## 2021-07-10 ENCOUNTER — Other Ambulatory Visit: Payer: Medicare Other

## 2021-07-10 DIAGNOSIS — K529 Noninfective gastroenteritis and colitis, unspecified: Secondary | ICD-10-CM

## 2021-07-10 DIAGNOSIS — Z148 Genetic carrier of other disease: Secondary | ICD-10-CM

## 2021-07-10 DIAGNOSIS — R1011 Right upper quadrant pain: Secondary | ICD-10-CM

## 2021-07-11 ENCOUNTER — Ambulatory Visit (HOSPITAL_COMMUNITY): Payer: Medicare Other

## 2021-07-12 ENCOUNTER — Other Ambulatory Visit: Payer: Self-pay

## 2021-07-12 ENCOUNTER — Ambulatory Visit (HOSPITAL_COMMUNITY)
Admission: RE | Admit: 2021-07-12 | Discharge: 2021-07-12 | Disposition: A | Discharge status: Home / Self Care | Payer: Medicare Other | Source: Ambulatory Visit | Attending: Gastroenterology | Admitting: Gastroenterology

## 2021-07-12 DIAGNOSIS — R1011 Right upper quadrant pain: Secondary | ICD-10-CM | POA: Insufficient documentation

## 2021-07-12 DIAGNOSIS — Z148 Genetic carrier of other disease: Secondary | ICD-10-CM | POA: Diagnosis present

## 2021-07-12 DIAGNOSIS — K529 Noninfective gastroenteritis and colitis, unspecified: Secondary | ICD-10-CM | POA: Diagnosis present

## 2021-07-15 LAB — GASTROINTESTINAL PATHOGEN PANEL PCR
C. difficile Tox A/B, PCR: NOT DETECTED
Campylobacter, PCR: NOT DETECTED
Cryptosporidium, PCR: NOT DETECTED
E coli (ETEC) LT/ST PCR: NOT DETECTED
E coli (STEC) stx1/stx2, PCR: NOT DETECTED
E coli 0157, PCR: NOT DETECTED
Giardia lamblia, PCR: NOT DETECTED
Norovirus, PCR: NOT DETECTED
Rotavirus A, PCR: NOT DETECTED
Salmonella, PCR: NOT DETECTED
Shigella, PCR: NOT DETECTED

## 2021-07-15 LAB — FECAL LACTOFERRIN, QUANT
Fecal Lactoferrin: NEGATIVE
MICRO NUMBER:: 12493764
SPECIMEN QUALITY:: ADEQUATE

## 2021-07-15 LAB — FECAL FAT, QUALITATIVE: FECAL FAT, QUALITATIVE: NORMAL

## 2021-07-16 LAB — PANCREATIC ELASTASE, FECAL: Pancreatic Elastase-1, Stool: 126 mcg/g — ABNORMAL LOW

## 2021-07-16 LAB — CALPROTECTIN, FECAL: Calprotectin, Fecal: <16 ug/g (ref 0–120)

## 2021-07-25 ENCOUNTER — Other Ambulatory Visit: Payer: Self-pay

## 2021-07-25 ENCOUNTER — Telehealth: Payer: Self-pay | Admitting: Gastroenterology

## 2021-07-25 MED ORDER — PANCRELIPASE (LIP-PROT-AMYL) 36000-114000 UNITS PO CPEP: 36000.0000 [IU] | ORAL_CAPSULE | Freq: Three times a day (TID) | ORAL | 3 refills | Status: AC

## 2021-07-25 NOTE — Telephone Encounter (Signed)
See results note. 

## 2021-07-25 NOTE — Telephone Encounter (Signed)
Inbound call from patient. Returning call about results

## 2021-08-28 ENCOUNTER — Encounter: Payer: Self-pay | Admitting: Gastroenterology

## 2021-08-30 ENCOUNTER — Ambulatory Visit (INDEPENDENT_AMBULATORY_CARE_PROVIDER_SITE_OTHER): Payer: Medicare Other | Admitting: Gastroenterology

## 2021-08-30 ENCOUNTER — Encounter: Payer: Self-pay | Admitting: Gastroenterology

## 2021-08-30 VITALS — BP 142/88 | HR 76 | Ht 62.5 in | Wt 138.5 lb

## 2021-08-30 DIAGNOSIS — Z148 Genetic carrier of other disease: Secondary | ICD-10-CM

## 2021-08-30 DIAGNOSIS — K580 Irritable bowel syndrome with diarrhea: Secondary | ICD-10-CM | POA: Diagnosis not present

## 2021-08-30 DIAGNOSIS — K8689 Other specified diseases of pancreas: Secondary | ICD-10-CM

## 2021-08-30 DIAGNOSIS — R1011 Right upper quadrant pain: Secondary | ICD-10-CM

## 2021-08-30 NOTE — Patient Instructions (Addendum)
Creon 36,000 capsules- Take 1 capsule by mouth with each meal daily. Try Sample , if this works then let Dr. Lavon Paganini know and we will send in prescription.   Otherwise you may try over the counter Pancreatic Enzymes.   If you are age 72 or older, your body mass index should be between 23-30. Your Body mass index is 24.93 kg/m. If this is out of the aforementioned range listed, please consider follow up with your Primary Care Provider.   The [place] [facility] providers would like to encourage you to use [facility] to communicate with providers for non-urgent requests or questions.  Due to long hold times on the telephone, sending your provider a message by [facility] may be a faster and more efficient way to get a response.  Please allow 48 business hours for a response.  Please remember that this is for non-urgent requests.    Thank you for choosing me and [facility].  Dr.Nandigam

## 2021-08-30 NOTE — Progress Notes (Signed)
Brianna Carpenter    [id number]    August 04, 1951  Primary Care Physician:Patient, No Pcp Per (Inactive)  Referring Physician: No referring provider defined for this encounter.   Chief complaint:  RUQ discomfort, diarrhea, pancreatic insufficiency  HPI:  70 year old very pleasant female here for follow up for chronic diarrhea  Overall she feels her symptoms are improving.  She is currently having on average 2 bowel movements per day with semiformed stool.  She is using aloe, artichoke and blackberry extract with improvement of symptoms She was experiencing diarrhea for past 6 years, on average she has 4-6 bowel movements per day with occasional nocturnal symptoms. She has changed her diet and is currently following mostly plant-based diet.  She has lost around 6 to 8 pounds in the past year, she feels she is losing weight without really trying.   She has intermittent right upper quadrant abdominal discomfort, is worried about chronic inflammation in the liver given her alpha 1 antitrypsin carrier status  She has normal qualitative fecal fat with low fecal elastase   GI pathogen panel negative for any acute GI infection Negative celiac panel  She was prescribed Creon but when she tried to fill the prescription, her out-of-pocket expense was almost $900, so she did not fill it  Denies any rectal bleeding, melena or blood in stool.  No recent antibiotics or sick contacts.   Last colonoscopy was with Dr. Loreta Ave in April 2017, was normal per patient with no polyp removal.  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  She is trying to wean off it.  She is currently avoiding taking any medications   Outpatient Encounter Medications as of 08/30/2021  Medication Sig   Aloe LIQD Take by mouth as needed.   AMBULATORY NON FORMULARY  MEDICATION CBD cream Use cream topical three times weekly   zolpidem (AMBIEN) 10 MG tablet Take 0.5 tablets by mouth at bedtime.   [DISCONTINUED] COVID-19 mRNA bivalent vaccine, Pfizer, (PFIZER COVID-19 VAC BIVALENT) injection Inject into the muscle.   [DISCONTINUED] LORazepam (ATIVAN) 1 MG tablet Take 1 mg by mouth at bedtime.   No facility-administered encounter medications on file as of 08/30/2021.    Allergies as of 08/30/2021 - Review Complete 08/30/2021  Allergen Reaction Noted   Fentanyl Anaphylaxis 12/22/2016   Augmentin [amoxicillin-pot clavulanate]  11/02/2018   Azithromycin  11/02/2018   Codeine  04/30/2011   Corn syrup [glucose]  02/11/2013   Latex  04/30/2011   Prevacid [lansoprazole]  02/09/2013   Thimerosol  04/30/2011    Past Medical History:  Diagnosis Date   Alpha-1-antitrypsin deficiency (HCC)    Arthritis    Feet   Chronic neck pain    Heart murmur 2018   Hypertension    Hypertensive retinopathy    Insomnia    Kidney stone 2010   Plantar fasciitis    TMJ (temporomandibular joint syndrome)    Vertigo     Past Surgical History:  Procedure Laterality Date   ABDOMINAL HYSTERECTOMY  1990   ADENOIDECTOMY     BACK SURGERY     eye correction     KIDNEY STONE SURGERY     left foot surgery     PARTIAL HYSTERECTOMY     TONSILLECTOMY      Family History  Problem Relation Age of  Onset   Heart attack Mother    Heart disease Father    Colon cancer Neg Hx    Esophageal cancer Neg Hx    Stomach cancer Neg Hx     Social History   Socioeconomic History   Marital status: Divorced    Spouse name: Not on file   Number of children: Not on file   Years of education: Not on file   Highest education level: Not on file  Occupational History   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  Tobacco Use   Smoking status: Never   Smokeless tobacco: Never  Vaping Use   Vaping Use: Never used  Substance and Sexual Activity    Alcohol use: Yes    Comment: occ   Drug use: No   Sexual activity: Never    Birth control/protection: Abstinence  Other Topics Concern   Not on file  Social History Narrative   3x a week---  Elliptical and treadmill   Social Determinants of Health   Financial Resource Strain: Not on file  Food Insecurity: Not on file  Transportation Needs: Not on file  Physical Activity: Not on file  Stress: Not on file  Social Connections: Not on file  Intimate Partner Violence: Not on file      Review of systems: All other review of systems negative except as mentioned in the HPI.   Physical Exam: Vitals:   08/30/21 0823  BP: (!) 142/88  Pulse: 76   Body mass index is 24.93 kg/m. 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 Creon was not covered by insurance, has significantly high out-of-pocket expense Patient was provided samples of Creon, advised her to use 1 capsule, 36,000 units lipase with every meal She could try the low-dose over-the-counter pancreatic enzyme supplements, Now Brand 1 capsule with every meal and can titrate up to 3 to 4 capsules with every meal as needed     Chronic right upper quadrant abdominal discomfort: Abdominal ultrasound unremarkable Continue to monitor  Return in 1 year or sooner if needed  This visit required 41 minutes of patient care (this includes precharting, chart review, review of results, face-to-face time used for counseling as well as treatment plan and  follow-up. The patient was provided an opportunity to ask questions and all were answered. The patient agreed with the plan and demonstrated an understanding of the instructions.  Iona Beard , MD    CC: No ref. provider found

## 2022-03-05 ENCOUNTER — Ambulatory Visit (INDEPENDENT_AMBULATORY_CARE_PROVIDER_SITE_OTHER): Payer: Medicare Other | Admitting: Podiatry

## 2022-03-05 DIAGNOSIS — L989 Disorder of the skin and subcutaneous tissue, unspecified: Secondary | ICD-10-CM | POA: Diagnosis not present

## 2022-03-05 NOTE — Progress Notes (Signed)
   Subjective: 71 y.o. female presenting to the office today as a new patient for evaluation of a symptomatic skin lesion to the plantar aspect of the fifth MTP joint right foot.  Patient states that over the last 6 months she has developed a symptomatic callus to the plantar aspect of this foot.  She would like to have it evaluated.  She presents for further treatment and evaluation   Past Medical History:  Diagnosis Date   Alpha-1-antitrypsin deficiency (HCC)    Arthritis    Feet   Chronic neck pain    Heart murmur 2018   Hypertension    Hypertensive retinopathy    Insomnia    Kidney stone 2010   Plantar fasciitis    TMJ (temporomandibular joint syndrome)    Vertigo    Past Surgical History:  Procedure Laterality Date   ABDOMINAL HYSTERECTOMY  1990   ADENOIDECTOMY     BACK SURGERY     eye correction     KIDNEY STONE SURGERY     left foot surgery     PARTIAL HYSTERECTOMY     TONSILLECTOMY     Allergies  Allergen Reactions   Fentanyl Anaphylaxis   Augmentin [Amoxicillin-Pot Clavulanate]     Severe vertigo and dizziness   Azithromycin     Irregular heartbeat   Codeine    Corn Syrup [Glucose]     High Fructose Corn Syrup    Latex    Prevacid [Lansoprazole]     Hair fell out   Thimerosol      Objective:  Physical Exam General: Alert and oriented x3 in no acute distress  Dermatology: Hyperkeratotic lesion(s) present on the plantar aspect of the fifth MTP right. Pain on palpation with a central nucleated core noted. Skin is warm, dry and supple bilateral lower extremities. Negative for open lesions or macerations.  Vascular: Palpable pedal pulses bilaterally. No edema or erythema noted. Capillary refill within normal limits.  Neurological: Epicritic and protective threshold grossly intact bilaterally.   Musculoskeletal Exam: Pain on palpation at the keratotic lesion(s) noted.  Hallux valgus also noted right foot with degenerative changes and  spurring.  Assessment: 1.  Porokeratosis/IPK right plantar fifth MTP joint   Plan of Care:  1. Patient evaluated 2. Excisional debridement of keratoic lesion(s) using a chisel blade was performed without incident.  3.  Salicylic acid applied.  Dressed area with light dressing. 4.  Recommend OTC corn callus remover daily as needed  5.  Patient is to return to the clinic PRN.   Felecia Shelling, DPM [facility]  Dr. Felecia Shelling, DPM    [address] [place], [place] [address]                Office [phone]  Fax [phone]

## 2022-06-19 ENCOUNTER — Encounter: Payer: Self-pay | Admitting: Gastroenterology

## 2022-06-19 ENCOUNTER — Ambulatory Visit (INDEPENDENT_AMBULATORY_CARE_PROVIDER_SITE_OTHER): Payer: Medicare Other | Admitting: Gastroenterology

## 2022-06-19 VITALS — BP 130/86 | HR 75 | Ht 62.0 in | Wt 140.4 lb

## 2022-06-19 DIAGNOSIS — K8689 Other specified diseases of pancreas: Secondary | ICD-10-CM | POA: Diagnosis not present

## 2022-06-19 DIAGNOSIS — K580 Irritable bowel syndrome with diarrhea: Secondary | ICD-10-CM | POA: Diagnosis not present

## 2022-06-19 NOTE — Progress Notes (Addendum)
Brianna Carpenter    [id number]    Mar 23, 1951  Primary Care Physician:Manfredi, Lesle Chris, MD  Referring Physician: Jolinda Croak, [place],  [place] [id number]   Chief complaint: Chronic diarrhea  HPI: 71 year old  female here for follow up for chronic diarrhea ~ 8 years, she was previously followed by Dr.Mann and switched to [facility] in 2022  She continues to have multiple semiformed bowel movements per day, on average 5-6 episodes.  She is currently not taking any prescription medication other than zolpidem that was prescribed s/p hysterectomy for insomnia many years ago, she requested her PMD to switch to sublingual because she was worried that it was causing diarrhea.  She did not notice any improvement.  She currently started using it vaginally this past week.   October 2022 She has normal qualitative fecal fat with low fecal elastase   GI pathogen panel negative for any acute GI infection Negative celiac panel   She was prescribed Creon but when she tried to fill the prescription, her out-of-pocket expense was almost $900, so she did not fill it. Instead she is using over-the-counter digestive enzyme  She takes multiple over-the-counter supplements. She has used aloe, artichoke and blackberry extract with some improvement of symptoms   Denies any rectal bleeding, melena or blood in stool.    Last colonoscopy was with Dr. Collene Mares in April 2017, was normal per patient with no polyp removal.  Report is not available to review during this visit   Other relevant medical history includes alpha 1 antitrypsin gene mutation    Outpatient Encounter Medications as of 06/19/2022  Medication Sig   Aloe LIQD Take by mouth as needed.   AMBULATORY NON FORMULARY MEDICATION CBD cream Use cream topical three times weekly   estradiol (ESTRACE) 0.1 MG/GM vaginal cream    aspirin 81 MG chewable tablet Chew by mouth.   No facility-administered  encounter medications on file as of 06/19/2022.    Allergies as of 06/19/2022 - Review Complete 06/19/2022  Allergen Reaction Noted   Fentanyl Anaphylaxis 12/22/2016   Augmentin [amoxicillin-pot clavulanate]  11/02/2018   Azithromycin  11/02/2018   Codeine  04/30/2011   Corn syrup [glucose]  02/11/2013   Latex  04/30/2011   Prevacid [lansoprazole]  02/09/2013   Thimerosol  04/30/2011         Past Medical History:  Diagnosis Date   Alpha-1-antitrypsin deficiency ([place])    Arthritis    Feet   Chronic neck pain    Heart murmur 2018   Hypertension    Hypertensive retinopathy    Insomnia    Kidney stone 2010   Plantar fasciitis    TMJ (temporomandibular joint syndrome)    Vertigo     Past Surgical History:  Procedure Laterality Date   ABDOMINAL HYSTERECTOMY  1990   ADENOIDECTOMY     BACK SURGERY     eye correction     KIDNEY STONE SURGERY     left foot surgery     PARTIAL HYSTERECTOMY     TONSILLECTOMY      Family History  Problem Relation Age of Onset   Heart attack Mother    Heart disease Father    Colon cancer Neg Hx    Esophageal cancer Neg Hx    Stomach cancer Neg Hx     Social History   Socioeconomic History   Marital status: Divorced  Spouse name: Not on file   Number of children: Not on file   Years of education: Not on file   Highest education level: Not on file  Occupational History   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  Tobacco Use   Smoking status: Never   Smokeless tobacco: Never  Vaping Use   Vaping Use: Never used  Substance and Sexual Activity   Alcohol use: Yes    Comment: occ   Drug use: No   Sexual activity: Never    Birth control/protection: Abstinence  Other Topics Concern   Not on file  Social History Narrative   3x a week---  Elliptical and treadmill   Social Determinants of Health   Financial Resource Strain: Not on file  Food Insecurity: Not on file  Transportation Needs:  Not on file  Physical Activity: Not on file  Stress: Not on file  Social Connections: Not on file  Intimate Partner Violence: Not on file      Review of systems: All other review of systems negative except as mentioned in the HPI.   Physical Exam: Vitals:   06/19/22 0820  BP: 130/86  Pulse: 75  SpO2: 98%    Body mass index is 25.68 kg/m. Gen:      No acute distress HEENT:  sclera anicteric Abd:     refused Neuro: alert and oriented x 3 Psych: normal mood and affect  Data Reviewed:  Reviewed labs, radiology imaging, old records and pertinent past GI work up   Assessment and Plan/Recommendations: 71 year old female with IBS-chronic diarrhea and exocrine pancreatic insufficiency She has alpha 1 antitrypsin gene mutation, is a carrier   IBS-diarrhea: Low fecal elastase consistent with exocrine pancreatic insufficiency Creon was not covered by insurance, patient is also very reluctant to take any prescription medication. She is taking OTC low dose digestive enzymes.  She recently started using zolpidem per vagina because she feels oral Zolpidem was causing chronic diarrhea, patient wanted to know if that was okay to use per vagina, advised her to discuss with her prescribing provider PMD and gynecologist for further guidance regarding usage per vagina.  Patient is worried about potential food sensitivity, she is negative for gluten allergy or celiac disease She does not think she has lactose intolerance, not interested in trial of lactose-free diet or with testing. Advised patient that we do not have specific testing for GI sensitivity to soy but we can consider alpha gal allergy testing as it can cause GI symptoms but patient declined  Offered colonoscopy with biopsies to exclude microscopic colitis, patient wanted to go back to Dr. Loreta Ave and discuss with her regarding colonoscopy as she performed her last colonoscopy in 2017.  Explained to patient that she cannot  simultaneously consult with 2 different gastroenterologist or go back and forth, its not possible to do procedure with a gastroenterologist and  then go back for an office visit with a different gastroenterologist to review results, I will have to release her from our practice if she wants to go back to Dr. Loreta Ave and she wanted to proceed with it.  No follow-up appointment made as patient plans to follow-up with Dr. Onnie Graham , MD    CC: Stevphen Rochester, MD

## 2022-06-23 ENCOUNTER — Encounter: Payer: Self-pay | Admitting: Gastroenterology

## 2022-06-24 ENCOUNTER — Telehealth: Payer: Self-pay | Admitting: Gastroenterology

## 2022-06-24 NOTE — Telephone Encounter (Signed)
Patient dismissed from [facility] by Harl Bowie, MD, effective 06/19/22. Dismissal Letter sent out by 1st class mail. KLM

## 2022-07-29 ENCOUNTER — Other Ambulatory Visit (HOSPITAL_BASED_OUTPATIENT_CLINIC_OR_DEPARTMENT_OTHER): Payer: Self-pay

## 2022-07-29 MED ORDER — COVID-19 MRNA VAC-TRIS(PFIZER) 30 MCG/0.3ML IM SUSY
0.3000 mL | PREFILLED_SYRINGE | Freq: Once | INTRAMUSCULAR | 0 refills | Status: AC
  Filled 2022-07-29: qty 0.3, 1d supply, fill #0

## 2022-12-12 ENCOUNTER — Ambulatory Visit (INDEPENDENT_AMBULATORY_CARE_PROVIDER_SITE_OTHER): Payer: Medicare Other | Admitting: Internal Medicine

## 2022-12-12 ENCOUNTER — Encounter: Payer: Self-pay | Admitting: Internal Medicine

## 2022-12-12 VITALS — BP 128/60 | HR 87 | Temp 98.9°F | Ht 62.0 in | Wt 143.4 lb

## 2022-12-12 DIAGNOSIS — Z148 Genetic carrier of other disease: Secondary | ICD-10-CM | POA: Diagnosis not present

## 2022-12-12 NOTE — Patient Instructions (Addendum)
Alpha-1-antitrypsin deficiency carrier  - stabile without symptoms  Plan - do full PFT in 6 months -Recheck of alpha-1 antitrypsin phenotype [I am not able to locate previous result in our computer system    Followup 6 months but after PFT

## 2022-12-12 NOTE — Progress Notes (Signed)
OV 07/27/2018  Subjective:  Patient ID: Brianna Carpenter, female , DOB: 1951/01/18 , age 72 y.o. , MRN: [id number] , ADDRESS: [address] [place] [address]   07/27/2018 -   Chief Complaint  Patient presents with   Consult    exposed to Borax, since she has been having increased SOB, alpha anti-1trypsin carrier, has had exposure clorox, feels like she can't take a deep breath, chest feels very tight and burns, dry cough x 8 weeks and post nasal drip.      HPI Brianna Carpenter 72 y.o. -female here for a new consult.  Self-referred.  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  But she has never smoked.  She does not have any biologic kids.  She tells me that approximately 8 years ago while at home she inhaled some bleach which was left open and after the suffered from shortness of breath and chest tightness that slowly work itself out over the next few to several years.  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  There was a spill of this powder.  Apparently this got sucked into her duct system.  And then 2 days later he injected out off the duct system and she started inhaling the chemicals.  Soon after she started noticing shortness of breath, chest tightness and even eye  burned and also nasal irritation.  A day later she figured this was because of the Borax.  She then checked herself into a hotel for 2 days.  She then returned home in the home still continue to smell.  She also had smell and cough.  She got cleaning system and had a house remediated.  She says the house is now almost free of smell.  She is able to live in the house now.  However she is continues to have shortness of breath with exertion and chest tightness but not much of a cough     she does not have any previous history of asthma or smoking history.  She feels her symptoms are improved since it started.  Symptoms are rated as  moderate.  There is no chest pain or diaphoresis.  She saw Dr. Michela Pitcher in [place].  She did a pulmonary function test with him on June 28, 2018 but I visualized these results and it is a poor technical quality and uninterpretable.  She had a chest x-ray with him that is clear.  I do not have this image with me.  She also tells me that in general medications do not agree with her because of numerous allergies.  Therefore she wants a very simplistic line of treatment.  Today we did exam nitric oxide was normal at 26.  We did walking desaturation test on 85 feet x 3 laps on room air: Resting pulse ox 99%.  Resting heart rate 72/min.  Final pulse ox 97% and final heart rate 99/min.  Heart tachycardic but otherwise normal.    Results for Brianna Carpenter, Brianna Carpenter (MRN [id number]) as of 07/27/2018 09:47  Ref. Range 06/20/2009 23:59 02/24/2011 14:26  Eosinophils Absolute Latest Ref Range: 0.0 - 0.7 K/uL 0.0 0.1     09/15/2018  - Visit   72 year old female patient presenting today to review pulmonary function test results.  Patient was suspected reactive airway disease due to chemical exposure 3 months ago.  Patient also had a  chemical exposure 8 years ago which caused 6 years of symptoms.  Patient reports that symptoms from 3 months ago have slowly started to resolve and now she is only having episodes of shortness of breath at night.  Patient reports that she feels that things have been going better since last office visit.  Patient reports that symptoms worsened acutely after pulmonary function test for 24 hours but those have since resolved.  Unfortunately patient refused to use albuterol or Xopenex during the pulmonary function test so we do not have a full PFT as originally intended.  09/08/2018-pulmonary function test-FVC 2.88 (98% predicted), ratio 85, FEV1 110, DLCO 92, no postbronchodilator was performed as patient refused to take albuterol or Xopenex  Patient with multiple allergies and patient is  resistant to taking many medications.  Patient reports she does not want to take inhalers as when she took an antibiotic she lost most of her hair and took 6 months to recover from the symptoms.   Tests:  09/08/2018-pulmonary function test-FVC 2.88 (98% predicted), ratio 85, FEV1 110, DLCO 92, no postbronchodilator was performed as patient refused to take albuterol or Xopenex    OV 11/02/2018  Subjective:  Patient ID: Brianna Carpenter, female , DOB: 05-20-1951 , age 72 y.o. , MRN: [id number] , ADDRESS: [address] [place] [address]   11/02/2018 -   Chief Complaint  Patient presents with   Follow-up    Pt states she is doing better since last visit after the exposure to chemicals. States she still has some postnasal drip but denies any complaints of cough, SOB, or chest tightness.     HPI Brianna Carpenter 72 y.o. -returns for follow-up.  At this point in time she says she is completely improved with the shortness of breath.  Denies any shortness of breath cough, chest tightness, weakness, wheezing, orthopnea, proximal nocturnal dyspnea.  The only thing she has is a chronic postnasal drip.  She is more worried about her being an alpha-1 antitrypsin carrier.  This was discovered incidentally in a commercial genetic test.  She is asking about serial monitoring of lung function.  She is not a smoker.  Most recent lung function was normal.  She is asking for local primary care support since her primary care physician is left private practice.  I have recommended [facility] or Dr Maia Petties of [place] 12/12/2022  Subjective:  Patient ID: Brianna Carpenter, female , DOB: 28-Aug-1951 , age 72 y.o. , MRN: [id number] , ADDRESS: [address] [place] [address] PCP Jolinda Croak, MD Patient Care Team: Jolinda Croak, MD as PCP - General (Family Medicine)  This Provider for this visit: Treatment Team:  Attending Provider: Brand Males, MD    12/12/2022 -   Chief  Complaint  Patient presents with   Consult    Est patient, seen last 10/2018.  Alpha 1 antitrypsin deficiency.  No sx.  Wanted yearly follow up.  Does have a dry cough due to allergies per patient.     HPI Brianna Carpenter 72 y.o. -essentially seeing her as a new consult because it has been more than 3 years since I last saw her.  She is alpha-1 antitrypsin known MZ (although I am not able to locate the result today in the computer].  She says she is doing well.  No health issues no shortness of breath no cough.  No wheezing.  No smoking.  She wanted to make  sure she got like follow-up.    PFT     Latest Ref Rng & Units 09/08/2018    8:51 AM  PFT Results  FVC-Pre L 2.88   FVC-Predicted Pre % 98   Pre FEV1/FVC % % 85   FEV1-Pre L 2.44   FEV1-Predicted Pre % 110   DLCO uncorrected ml/min/mmHg 21.09   DLCO UNC% % 92   DLVA Predicted % 108   TLC L 5.31   TLC % Predicted % 108   RV % Predicted % 118        has a past medical history of Alpha-1-antitrypsin deficiency ([place]), Arthritis, Chronic neck pain, Heart murmur (2018), Hypertension, Hypertensive retinopathy, Insomnia, Kidney stone (2010), Plantar fasciitis, TMJ (temporomandibular joint syndrome), and Vertigo.   reports that she has never smoked. She has never used smokeless tobacco.  Past Surgical History:  Procedure Laterality Date   ABDOMINAL HYSTERECTOMY  1990   ADENOIDECTOMY     BACK SURGERY     eye correction     KIDNEY STONE SURGERY     left foot surgery     PARTIAL HYSTERECTOMY     TONSILLECTOMY      Allergies  Allergen Reactions   Fentanyl Anaphylaxis   Augmentin [Amoxicillin-Pot Clavulanate]     Severe vertigo and dizziness   Azithromycin     Irregular heartbeat   Codeine    Corn Syrup [Glucose]     High Fructose Corn Syrup    Latex    Prevacid [Lansoprazole]     Hair fell out   Thimerosol     Immunization History  Administered Date(s) Administered   COVID-19, mRNA, vaccine(Comirnaty)12 years and  older 07/29/2022   PFIZER(Purple Top)SARS-COV-2 Vaccination 10/22/2019, 11/12/2019, 07/03/2020   Pfizer Covid-19 Vaccine Bivalent Booster 66yrs & up 06/26/2021   Pneumococcal Conjugate-13 05/07/2015   Pneumococcal Polysaccharide-23 06/15/2017   Pneumococcal-Unspecified 06/15/2017   Td 03/29/2012    Family History  Problem Relation Age of Onset   Heart attack Mother    Heart disease Father    Colon cancer Neg Hx    Esophageal cancer Neg Hx    Stomach cancer Neg Hx      Current Outpatient Medications:    Aloe LIQD, Take by mouth as needed., Disp: , Rfl:    AMBULATORY NON FORMULARY MEDICATION, CBD cream Use cream topical three times weekly, Disp: , Rfl:    aspirin 81 MG chewable tablet, Chew 81 mg by mouth daily., Disp: , Rfl:    estradiol (ESTRACE) 0.1 MG/GM vaginal cream, , Disp: , Rfl:    OVER THE COUNTER MEDICATION, Take 1 tablet by mouth at bedtime. THC Delta 8  OTC    One chewable gummie at bedtime as needed for sleep, Disp: , Rfl:    zolpidem (AMBIEN) 5 MG tablet, Take 5 mg by mouth at bedtime as needed for sleep., Disp: , Rfl:    aspirin EC 81 MG tablet, Take 81 mg by mouth daily. Swallow whole. (Patient not taking: Reported on 12/12/2022), Disp: , Rfl:    DIGESTIVE ENZYMES PO, Take by mouth. (Patient not taking: Reported on 12/12/2022), Disp: , Rfl:       Objective:   Vitals:   12/12/22 0925  BP: 128/60  Pulse: 87  Temp: 98.9 F (37.2 C)  TempSrc: Oral  SpO2: 97%  Weight: 143 lb 6.4 oz (65 kg)  Height: 5\' 2"  (1.575 m)    Estimated body mass index is 26.23 kg/m as calculated from the following:  Height as of this encounter: 5\' 2"  (1.575 m).   Weight as of this encounter: 143 lb 6.4 oz (65 kg).  @WEIGHTCHANGE @  Filed Weights   12/12/22 0925  Weight: 143 lb 6.4 oz (65 kg)      General: No distress. Looks well Neuro: Alert and Oriented x 3. GCS 15. Speech normal Psych: Pleasant Resp:  Barrel Chest - no.  Wheeze - no, Crackles - no, No overt respiratory  distress CVS: Normal heart sounds. Murmurs - no Ext: Stigmata of Connective Tissue Disease - no HEENT: Normal upper airway. PEERL +. No post nasal drip        Assessment:       ICD-10-CM   1. Alpha-1-antitrypsin deficiency carrier  Z14.8          Plan:     Patient Instructions  Alpha-1-antitrypsin deficiency carrier  - stabile without symptoms  Plan - do full PFT in 6 months -Recheck of alpha-1 antitrypsin phenotype [I am not able to locate previous result in our computer system    Followup 6 months but after PFT    SIGNATURE    Dr. Brand Males, M.D., F.C.C.P,  Pulmonary and Critical Care Medicine Staff Physician, [place] Director - Interstitial Lung Disease  Program  Pulmonary [place] [facility] [place], [place], [address]  Pager: [phone], If no answer or between  15:00h - 7:00h: call [phone] Telephone: [phone]  10:18 AM 12/12/2022

## 2022-12-23 LAB — ALPHA-1 ANTITRYPSIN PHENOTYPE: A-1 Antitrypsin, Ser: 77 mg/dL — ABNORMAL LOW (ref 83–199)

## 2023-01-29 ENCOUNTER — Ambulatory Visit: Payer: Medicare Other

## 2023-02-20 LAB — PULMONARY FUNCTION TEST
FEF 25-75 Pre: 3.23 L/sec
FEF2575-%Pred-Pre: 196 %
FEV1-%Pred-Pre: 173 %
FEV1-Pre: 3.46 L
FEV1FVC-%Pred-Pre: 18 %
FEV6-%Pred-Pre: 778 %
FEV6-Pre: 19.59 L
FEV6FVC-%Pred-Pre: 83 %
FVC-%Pred-Pre: 929 %
FVC-Pre: 24.61 L
Pre FEV1/FVC ratio: 14 %
Pre FEV6/FVC Ratio: 80 %

## 2023-04-13 ENCOUNTER — Other Ambulatory Visit: Payer: Self-pay

## 2023-04-13 DIAGNOSIS — Z148 Genetic carrier of other disease: Secondary | ICD-10-CM

## 2023-04-16 ENCOUNTER — Ambulatory Visit (INDEPENDENT_AMBULATORY_CARE_PROVIDER_SITE_OTHER): Payer: Medicare Other | Admitting: Internal Medicine

## 2023-04-16 ENCOUNTER — Encounter (HOSPITAL_BASED_OUTPATIENT_CLINIC_OR_DEPARTMENT_OTHER): Payer: Medicare Other

## 2023-04-16 DIAGNOSIS — Z148 Genetic carrier of other disease: Secondary | ICD-10-CM

## 2023-04-16 LAB — PULMONARY FUNCTION TEST
DL/VA % pred: 93 %
DL/VA: 3.91 ml/min/mmHg/L
DLCO cor % pred: 96 %
DLCO cor: 17.26 ml/min/mmHg
DLCO unc % pred: 96 %
DLCO unc: 17.26 ml/min/mmHg
FEF 25-75 Post: 2.71 L/sec
FEF 25-75 Pre: 2.57 L/sec
FEF2575-%Change-Post: 5 %
FEF2575-%Pred-Post: 165 %
FEF2575-%Pred-Pre: 156 %
FEV1-%Change-Post: 2 %
FEV1-%Pred-Post: 112 %
FEV1-%Pred-Pre: 110 %
FEV1-Post: 2.24 L
FEV1-Pre: 2.2 L
FEV1FVC-%Change-Post: 1 %
FEV1FVC-%Pred-Pre: 110 %
FEV6-%Change-Post: 0 %
FEV6-%Pred-Post: 104 %
FEV6-%Pred-Pre: 104 %
FEV6-Post: 2.64 L
FEV6-Pre: 2.63 L
FEV6FVC-%Pred-Post: 105 %
FEV6FVC-%Pred-Pre: 105 %
FVC-%Change-Post: 0 %
FVC-%Pred-Post: 99 %
FVC-%Pred-Pre: 99 %
FVC-Post: 2.64 L
FVC-Pre: 2.63 L
Post FEV1/FVC ratio: 85 %
Post FEV6/FVC ratio: 100 %
Pre FEV1/FVC ratio: 83 %
Pre FEV6/FVC Ratio: 100 %
RV % pred: 175 %
RV: 3.77 L
TLC % pred: 136 %
TLC: 6.49 L

## 2023-04-16 NOTE — Patient Instructions (Signed)
Full PFT performed today. °

## 2023-04-16 NOTE — Progress Notes (Signed)
Full PFT performed today. °

## 2023-05-06 ENCOUNTER — Encounter: Payer: Self-pay | Admitting: Internal Medicine

## 2023-06-08 ENCOUNTER — Encounter: Payer: Self-pay | Admitting: Internal Medicine

## 2023-06-08 ENCOUNTER — Ambulatory Visit (INDEPENDENT_AMBULATORY_CARE_PROVIDER_SITE_OTHER): Payer: Medicare Other | Admitting: Internal Medicine

## 2023-06-08 VITALS — BP 114/78 | HR 65 | Temp 98.3°F | Ht 62.0 in | Wt 142.0 lb

## 2023-06-08 DIAGNOSIS — Z148 Genetic carrier of other disease: Secondary | ICD-10-CM | POA: Diagnosis not present

## 2023-06-08 NOTE — Progress Notes (Signed)
OV 07/27/2018  Subjective:  Patient ID: Brianna Carpenter, female , DOB: 12-16-1950 , age 72 y.o. , MRN: [id number] , ADDRESS: [address] 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      HPI Brianna Carpenter 72 y.o. -female here for a new consult.  Self-referred.  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  But she has never smoked.  She does not have any biologic kids.  She tells me that approximately 8 years ago while at home she inhaled some bleach which was left open and after the suffered from shortness of breath and chest tightness that slowly work itself out over the next few to several years.  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  There was a spill of this powder.  Apparently this got sucked into her duct system.  And then 2 days later he injected out off the duct system and she started inhaling the chemicals.  Soon after she started noticing shortness of breath, chest tightness and even eye  burned and also nasal irritation.  A day later she figured this was because of the Borax.  She then checked herself into a hotel for 2 days.  She then returned home in the home still continue to smell.  She also had smell and cough.  She got cleaning system and had a house remediated.  She says the house is now almost free of smell.  She is able to live in the house now.  However she is continues to have shortness of breath with exertion and chest tightness but not much of a cough     she does not have any previous history of asthma or smoking history.  She feels her symptoms are improved since it started.  Symptoms are rated as moderate.   There is no chest pain or diaphoresis.  She saw Brianna Carpenter in [place].  She did a pulmonary function test with him on June 28, 2018 but I visualized these results and it is a poor technical quality and uninterpretable.  She had a chest x-ray with him that is clear.  I do not have this image with me.  She also tells me that in general medications do not agree with her because of numerous allergies.  Therefore she wants a very simplistic line of treatment.  Today we did exam nitric oxide was normal at 26.  We did walking desaturation test on 85 feet x 3 laps on room air: Resting pulse ox 99%.  Resting heart rate 72/min.  Final pulse ox 97% and final heart rate 99/min.  Heart tachycardic but otherwise normal.    Results for Brianna Carpenter (MRN [id number]) as of 07/27/2018 09:47  Ref. Range 06/20/2009 23:59 02/24/2011 14:26  Eosinophils Absolute Latest Ref Range: 0.0 - 0.7 K/uL 0.0 0.1     09/15/2018  - Visit   72 year old female patient presenting today to review pulmonary function test results.  Patient was suspected reactive airway disease due to chemical exposure 3 months ago.  Patient also had a chemical exposure  8 years ago which caused 6 years of symptoms.  Patient reports that symptoms from 3 months ago have slowly started to resolve and now she is only having episodes of shortness of breath at night.  Patient reports that she feels that things have been going better since last office visit.  Patient reports that symptoms worsened acutely after pulmonary function test for 24 hours but those have since resolved.  Unfortunately patient refused to use albuterol or Xopenex during the pulmonary function test so we do not have a full PFT as originally intended.  09/08/2018-pulmonary function test-FVC 2.88 (98% predicted), ratio 85, FEV1 110, DLCO 92, no postbronchodilator was performed as patient refused to take albuterol or Xopenex  Patient with multiple allergies and patient is resistant to  taking many medications.  Patient reports she does not want to take inhalers as when she took an antibiotic she lost most of her hair and took 6 months to recover from the symptoms.   Tests:  09/08/2018-pulmonary function test-FVC 2.88 (98% predicted), ratio 85, FEV1 110, DLCO 92, no postbronchodilator was performed as patient refused to take albuterol or Xopenex    OV 11/02/2018  Subjective:  Patient ID: Brianna Carpenter, female , DOB: May 04, 1951 , age 37 y.o. , MRN: [id number] , ADDRESS: [address] [place] [address]   11/02/2018 -   Chief Complaint  Patient presents with   Follow-up    Pt states she is doing better since last visit after the exposure to chemicals. States she still has some postnasal drip but denies any complaints of cough, SOB, or chest tightness.     HPI Brianna Carpenter 72 y.o. -returns for follow-up.  At this point in time she says she is completely improved with the shortness of breath.  Denies any shortness of breath cough, chest tightness, weakness, wheezing, orthopnea, proximal nocturnal dyspnea.  The only thing she has is a chronic postnasal drip.  She is more worried about her being an alpha-1 antitrypsin carrier.  This was discovered incidentally in a commercial genetic test.  She is asking about serial monitoring of lung function.  She is not a smoker.  Most recent lung function was normal.  She is asking for local primary care support since her primary care physician is left private practice.  I have recommended [facility] or Dr Brianna [facility] of [facility]     OV 12/12/2022  Subjective:  Patient ID: Brianna Carpenter, female , DOB: July 04, 1951 , age 48 y.o. , MRN: [id number] , ADDRESS: [address] [place] [address] PCP Brianna Rochester, MD Patient Care Team: Brianna Rochester, MD as PCP - General (Family Medicine)  This Provider for this visit: Treatment Team:  Attending Provider: Kalman Shan, MD    12/12/2022 -   Chief Complaint   Patient presents with   Consult    Est patient, seen last 10/2018.  Alpha 1 antitrypsin deficiency.  No sx.  Wanted yearly follow up.  Does have a dry cough due to allergies per patient.     HPI Brianna Carpenter 72 y.o. -essentially seeing her as a new consult because it has been more than 3 years since I last saw her.  She is alpha-1 antitrypsin known MZ (although I am not able to locate the result today in the computer].  She says she is doing well.  No health issues no shortness of breath no cough.  No wheezing.  No smoking.  She wanted to make sure she  got like follow-up.       OV 06/08/2023  Subjective:  Patient ID: Brianna Carpenter, female , DOB: 07/03/1951 , age 60 y.o. , MRN: [id number] , ADDRESS: [address] [place] [address] PCP Brianna Rochester, MD Patient Care Team: Brianna Rochester, MD as PCP - General (Family Medicine)  This Provider for this visit: Treatment Team:  Attending Provider: Kalman Shan, MD    06/08/2023 -   Chief Complaint  Patient presents with   Follow-up    Breathing is "great". No new co's.      HPI Brianna Carpenter 72 y.o. -alpha-1 MZ  Presents for follow-up.  Last visit we checked alpha-1 MZ and the level was 77 mg/dL.  She states many years ago it was in the 40s.  I did indicate to her that both levels are low and did suggest that if she were smoker she would have definitely develop COPD.  Fortunately her pulmonary function test continues to be normal.  Her current pulmonary function test is also normal.  She had an abnormal one in May but that was because of the fact the machine was broken.  Some of this got resulted.  But the current July er 2024 1 is the same as 2019 and there is no change in pulmonary function.  She walks daily and she has improvement in blood pressure.  She cannot have the flu shot of the RSV shot because of thimerosal allergy.  But she will going to have the COVID shot and the pneumonia shot.  We discussed  family history of alpha-1 she does not have any biologic kids.  She has 2 sisters and they have children.  She says none of the biological family wants to get alpha-1 tested.  I did print and give her the result so she can continue counseling the family.  SYMPTOM SCALE -  06/08/2023  Current weight   O2 use ra  Shortness of Breath 0 -> 5 scale with 5 being worst (score 6 If unable to do)  At rest 0  Simple tasks - showers, clothes change, eating, shaving 0  Household (dishes, doing bed, laundry) 0  Shopping 0  Walking level at own pace 0  Walking up Stairs 1  Total (30-36) Dyspnea Score 0  How bad is your cough? 00  How bad is your fatigue 0  How bad is nausea 0  How bad is vomiting?  0  How bad is diarrhea? 0  How bad is anxiety? 0  How bad is depression 0  Any chronic pain - if so where and how bad 0     PFT    Latest Ref Rng & Units 04/16/2023    2:48 PM 01/29/2023    9:55 AM 09/08/2018    8:51 AM  PFT Results  FVC-Pre L 2.63  24.61  2.88   FVC-Predicted Pre % 99  929  98   FVC-Post L 2.64     FVC-Predicted Post % 99     Pre FEV1/FVC % % 83  14  85   Post FEV1/FCV % % 85     FEV1-Pre L 2.20  3.46  2.44   FEV1-Predicted Pre % 110  173  110   FEV1-Post L 2.24     DLCO uncorrected ml/min/mmHg 17.26   21.09   DLCO UNC% % 96   92   DLCO corrected ml/min/mmHg 17.26     DLCO COR %Predicted % 96  DLVA Predicted % 93   108   TLC L 6.49   5.31   TLC % Predicted % 136   108   RV % Predicted % 175   118       LAB RESULTS last 96 hours No results found.  LAB RESULTS last 90 days Recent Results (from the past 2160 hour(s))  Pulmonary function test     Status: None   Collection Time: 04/16/23  2:48 PM  Result Value Ref Range   FVC-Pre 2.63 L   FVC-%Pred-Pre 99 %   FVC-Post 2.64 L   FVC-%Pred-Post 99 %   FVC-%Change-Post 0 %   FEV1-Pre 2.20 L   FEV1-%Pred-Pre 110 %   FEV1-Post 2.24 L   FEV1-%Pred-Post 112 %   FEV1-%Change-Post 2 %   FEV6-Pre 2.63 L    FEV6-%Pred-Pre 104 %   FEV6-Post 2.64 L   FEV6-%Pred-Post 104 %   FEV6-%Change-Post 0 %   Pre FEV1/FVC ratio 83 %   FEV1FVC-%Pred-Pre 110 %   Post FEV1/FVC ratio 85 %   FEV1FVC-%Change-Post 1 %   Pre FEV6/FVC Ratio 100 %   FEV6FVC-%Pred-Pre 105 %   Post FEV6/FVC ratio 100 %   FEV6FVC-%Pred-Post 105 %   FEF 25-75 Pre 2.57 L/sec   FEF2575-%Pred-Pre 156 %   FEF 25-75 Post 2.71 L/sec   FEF2575-%Pred-Post 165 %   FEF2575-%Change-Post 5 %   RV 3.77 L   RV % pred 175 %   TLC 6.49 L   TLC % pred 136 %   DLCO unc 17.26 ml/min/mmHg   DLCO unc % pred 96 %   DLCO cor 17.26 ml/min/mmHg   DLCO cor % pred 96 %   DL/VA 2.53 ml/min/mmHg/L   DL/VA % pred 93 %         has a past medical history of Alpha-1-antitrypsin deficiency (HCC), Arthritis, Chronic neck pain, Heart murmur (2018), Hypertension, Hypertensive retinopathy, Insomnia, Kidney stone (2010), Plantar fasciitis, TMJ (temporomandibular joint syndrome), and Vertigo.   reports that she has never smoked. She has never used smokeless tobacco.  Past Surgical History:  Procedure Laterality Date   ABDOMINAL HYSTERECTOMY  1990   ADENOIDECTOMY     BACK SURGERY     eye correction     KIDNEY STONE SURGERY     left foot surgery     PARTIAL HYSTERECTOMY     TONSILLECTOMY      Allergies  Allergen Reactions   Fentanyl Anaphylaxis   Augmentin [Amoxicillin-Pot Clavulanate]     Severe vertigo and dizziness   Azithromycin     Irregular heartbeat   Codeine    Corn Syrup [Glucose]     High Fructose Corn Syrup    Latex    Prevacid [Lansoprazole]     Hair fell out   Thimerosol     Immunization History  Administered Date(s) Administered   COVID-19, mRNA, vaccine(Comirnaty)12 years and older 07/29/2022   PFIZER(Purple Top)SARS-COV-2 Vaccination 10/22/2019, 11/12/2019, 07/03/2020   Pfizer Covid-19 Vaccine Bivalent Booster 73yrs & up 06/26/2021   Pneumococcal Conjugate-13 05/07/2015   Pneumococcal Polysaccharide-23 06/15/2017    Pneumococcal-Unspecified 06/15/2017   Td 03/29/2012    Family History  Problem Relation Age of Onset   Heart attack Mother    Heart disease Father    Colon cancer Neg Hx    Esophageal cancer Neg Hx    Stomach cancer Neg Hx      Current Outpatient Medications:    Aloe LIQD, Take by mouth as needed., Disp: ,  Rfl:    AMBULATORY NON FORMULARY MEDICATION, CBD cream Use cream topical three times weekly, Disp: , Rfl:    aspirin 81 MG chewable tablet, Chew 81 mg by mouth daily., Disp: , Rfl:    Cholecalciferol 50 MCG (2000 UT) CAPS, Take 1 capsule by mouth daily., Disp: , Rfl:    estradiol (ESTRACE) 0.1 MG/GM vaginal cream, , Disp: , Rfl:    OVER THE COUNTER MEDICATION, Take 1 tablet by mouth at bedtime. THC Delta 8  OTC    One chewable gummie at bedtime as needed for sleep, Disp: , Rfl:       Objective:   Vitals:   06/08/23 1128  BP: 114/78  Pulse: 65  Temp: 98.3 F (36.8 C)  TempSrc: Oral  SpO2: 97%  Weight: 142 lb (64.4 kg)  Height: 5\' 2"  (1.575 m)    Estimated body mass index is 25.97 kg/m as calculated from the following:   Height as of this encounter: 5\' 2"  (1.575 m).   Weight as of this encounter: 142 lb (64.4 kg).  @WEIGHTCHANGE @  [employer]   06/08/23 1128  Weight: 142 lb (64.4 kg)     Physical Exam   General: No distress. Loooks well O2 at rest: no Cane present: no Sitting in wheel chair: no Frail: no Obese: no Neuro: Alert and Oriented x 3. GCS 15. Speech normal Psych: Pleasant Resp:  Barrel Chest - no.  Wheeze - no, Crackles - no, No overt respiratory distress CVS: Normal heart sounds. Murmurs - no Ext: Stigmata of Connective Tissue Disease - no HEENT: Normal upper airway. PEERL +. No post nasal drip        Assessment:       ICD-10-CM   1. Alpha-1-antitrypsin deficiency carrier  Z14.8          Plan:     Patient Instructions  Alpha-1-antitrypsin deficiency carrier  - asymptomatic MZ with low levels - PFT stable 2019->  2024 -glad you neer smoked  Plan - do spiro/dlco in 12 months Continue to encourage family to get alpha 1 tested  - respect no flu or rsv shot due to thiomerosal allergy    Followup 12 months but after spiro/dlco   FOLLOWUP No follow-ups on file.    SIGNATURE    Dr. Kalman Carpenter, M.D., F.C.C.P,  Pulmonary and Critical Care Medicine Staff Physician, [facility] Director - Interstitial Lung Disease  Program  Pulmonary Fibrosis [facility] at [facility] [place], [place], [address]  Pager: [phone], If no answer or between  15:00h - 7:00h: call [phone] Telephone: [phone]  12:03 PM 06/08/2023

## 2023-06-08 NOTE — Patient Instructions (Addendum)
Alpha-1-antitrypsin deficiency carrier  - asymptomatic MZ with low levels - PFT stable 2019-> 2024 -glad you neer smoked  Plan - do spiro/dlco in 12 months Continue to encourage family to get alpha 1 tested  - respect no flu or rsv shot due to thiomerosal allergy    Followup 12 months but after spiro/dlco

## 2023-07-01 ENCOUNTER — Other Ambulatory Visit (HOSPITAL_BASED_OUTPATIENT_CLINIC_OR_DEPARTMENT_OTHER): Payer: Self-pay

## 2023-07-01 MED ORDER — COMIRNATY 30 MCG/0.3ML IM SUSY
0.3000 mL | PREFILLED_SYRINGE | Freq: Once | INTRAMUSCULAR | 0 refills | Status: AC
  Filled 2023-07-01: qty 0.3, 1d supply, fill #0

## 2023-08-12 ENCOUNTER — Encounter: Payer: Self-pay | Admitting: Podiatry

## 2023-08-12 ENCOUNTER — Ambulatory Visit (INDEPENDENT_AMBULATORY_CARE_PROVIDER_SITE_OTHER): Payer: Medicare Other | Admitting: Podiatry

## 2023-08-12 DIAGNOSIS — D2371 Other benign neoplasm of skin of right lower limb, including hip: Secondary | ICD-10-CM

## 2023-08-12 NOTE — Progress Notes (Signed)
   Chief Complaint  Patient presents with   Callouses    PATIENT STATES SHE HAS A CALLOUSES AT THE BOTTOM SIDE OF RF , BEEN THERE FOR A MONTH OR 2 . NO MEDICATION FOR PAIN .     Subjective: 72 y.o. female presenting to the office today for evaluation of pain and tenderness associated to a symptomatic skin lesion to the plantar aspect of the fifth MTP of the right foot.  Idiopathic onset gradually worse over the last few months.  She has not done anything for treatment   Past Medical History:  Diagnosis Date   Alpha-1-antitrypsin deficiency (HCC)    Arthritis    Feet   Chronic neck pain    Heart murmur 2018   Hypertension    Hypertensive retinopathy    Insomnia    Kidney stone 2010   Plantar fasciitis    TMJ (temporomandibular joint syndrome)    Vertigo     Past Surgical History:  Procedure Laterality Date   ABDOMINAL HYSTERECTOMY  1990   ADENOIDECTOMY     BACK SURGERY     eye correction     KIDNEY STONE SURGERY     left foot surgery     PARTIAL HYSTERECTOMY     TONSILLECTOMY      Allergies  Allergen Reactions   Fentanyl Anaphylaxis   Augmentin [Amoxicillin-Pot Clavulanate]     Severe vertigo and dizziness   Azithromycin     Irregular heartbeat   Codeine    Corn Syrup [Glucose]     High Fructose Corn Syrup    Latex    Prevacid [Lansoprazole]     Hair fell out   Thimerosol      Objective:  Physical Exam General: Alert and oriented x3 in no acute distress  Dermatology: Hyperkeratotic lesion(s) present on the plantar aspect of the fifth MTP right. Pain on palpation with a central nucleated core noted. Skin is warm, dry and supple bilateral lower extremities. Negative for open lesions or macerations.  Vascular: Palpable pedal pulses bilaterally. No edema or erythema noted. Capillary refill within normal limits.  Neurological: Grossly intact via light touch  Musculoskeletal Exam: Pain on palpation at the keratotic lesion(s) noted. Range of motion within  normal limits bilateral. Muscle strength 5/5 in all groups bilateral.  Assessment: 1.  Symptomatic benign skin lesion   Plan of Care:  -Patient evaluated -Excisional debridement of keratoic lesion(s) using a chisel blade was performed without incident.  -Salicylic acid applied with a bandaid -Return to the clinic PRN.   Felecia Shelling, DPM [facility]  Dr. Felecia Shelling, DPM    [address] [place], [place] [address]                Office [phone]  Fax [phone]

## 2024-01-04 ENCOUNTER — Ambulatory Visit (INDEPENDENT_AMBULATORY_CARE_PROVIDER_SITE_OTHER): Admitting: Podiatry

## 2024-01-04 ENCOUNTER — Encounter: Payer: Self-pay | Admitting: Podiatry

## 2024-01-04 DIAGNOSIS — M205X2 Other deformities of toe(s) (acquired), left foot: Secondary | ICD-10-CM | POA: Diagnosis not present

## 2024-01-04 DIAGNOSIS — D2371 Other benign neoplasm of skin of right lower limb, including hip: Secondary | ICD-10-CM | POA: Diagnosis not present

## 2024-01-04 NOTE — Progress Notes (Signed)
   Chief Complaint  Patient presents with   Callouses    R foot has spot that needs to be shaved down    Subjective: 73 y.o. female presenting to the office today for follow-up evaluation of a symptomatic skin lesion to the plantar aspect of the fifth MTP of the right foot.  Slowly the lesion has returned.  She also states that she has a history of surgery to the left great toe and it is now tender intermittently.  She is not interested in any surgery or intervention for the moment.   Past Medical History:  Diagnosis Date   Alpha-1-antitrypsin deficiency (HCC)    Arthritis    Feet   Chronic neck pain    Heart murmur 2018   Hypertension    Hypertensive retinopathy    Insomnia    Kidney stone 2010   Plantar fasciitis    TMJ (temporomandibular joint syndrome)    Vertigo     Past Surgical History:  Procedure Laterality Date   ABDOMINAL HYSTERECTOMY  1990   ADENOIDECTOMY     BACK SURGERY     eye correction     KIDNEY STONE SURGERY     left foot surgery     PARTIAL HYSTERECTOMY     TONSILLECTOMY      Allergies  Allergen Reactions   Fentanyl Anaphylaxis   Augmentin [Amoxicillin-Pot Clavulanate]     Severe vertigo and dizziness   Azithromycin     Irregular heartbeat   Codeine    Corn Syrup [Glucose]     High Fructose Corn Syrup    Latex    Prevacid [Lansoprazole]     Hair fell out   Thimerosol      Objective:  Physical Exam General: Alert and oriented x3 in no acute distress  Dermatology: Hyperkeratotic lesion(s) present on the plantar aspect of the fifth MTP right. Pain on palpation with a central nucleated core noted. Skin is warm, dry and supple bilateral lower extremities. Negative for open lesions or macerations.  Vascular: Palpable pedal pulses bilaterally. No edema or erythema noted. Capillary refill within normal limits.  Neurological: Grossly intact via light touch  Musculoskeletal Exam: Pain on palpation at the keratotic lesion(s) noted.  Limited  range of motion also noted the first MTP of the left foot with associated tenderness and crepitus consistent with hallux limitus and arthritis. Hallux valgus also noted to the right foot  Assessment: 1.  Eccrine for ulnar plantar aspect of the fifth MTP right 2.  Hallux limitus left; history of cheilectomy surgery several years prior 3.  Hallux valgus right   Plan of Care:  -Patient evaluated -Excisional debridement of keratoic lesion(s) using a chisel blade was performed without incident.  -Salicylic acid applied with a bandaid - Patient declined x-rays today.  She states that she would like to get them done next time -Return to clinic as needed  Felecia Shelling, DPM [facility]  Dr. Felecia Shelling, DPM    [address] [place], [place] [address]                Office [phone]  Fax [phone]

## 2024-04-11 ENCOUNTER — Ambulatory Visit: Admitting: Podiatry

## 2024-04-18 ENCOUNTER — Ambulatory Visit (INDEPENDENT_AMBULATORY_CARE_PROVIDER_SITE_OTHER): Admitting: Podiatry

## 2024-04-18 ENCOUNTER — Encounter: Payer: Self-pay | Admitting: Podiatry

## 2024-04-18 ENCOUNTER — Ambulatory Visit (INDEPENDENT_AMBULATORY_CARE_PROVIDER_SITE_OTHER)

## 2024-04-18 VITALS — Ht 62.0 in | Wt 142.0 lb

## 2024-04-18 DIAGNOSIS — M205X2 Other deformities of toe(s) (acquired), left foot: Secondary | ICD-10-CM | POA: Diagnosis not present

## 2024-04-18 DIAGNOSIS — M2022 Hallux rigidus, left foot: Secondary | ICD-10-CM | POA: Diagnosis not present

## 2024-04-18 NOTE — Progress Notes (Signed)
 Chief Complaint  Patient presents with   Toe Pain    Pt is here due to left great toe pain, pain has been there off and on for 20 years, pain radiates from great toe to the top of her foot, no prior injury.    Subjective: 73 y.o. female presenting to the office today for follow-up evaluation of hallux rigidus to the left great toe joint.  Today she would like to have x-rays to have it evaluated.  She declined x-rays last visit.   Past Medical History:  Diagnosis Date   Alpha-1-antitrypsin deficiency (HCC)    Arthritis    Feet   Chronic neck pain    Heart murmur 2018   Hypertension    Hypertensive retinopathy    Insomnia    Kidney stone 2010   Plantar fasciitis    TMJ (temporomandibular joint syndrome)    Vertigo     Past Surgical History:  Procedure Laterality Date   ABDOMINAL HYSTERECTOMY  1990   ADENOIDECTOMY     BACK SURGERY     eye correction     KIDNEY STONE SURGERY     left foot surgery     PARTIAL HYSTERECTOMY     TONSILLECTOMY      Allergies  Allergen Reactions   Fentanyl Anaphylaxis   Augmentin [Amoxicillin-Pot Clavulanate]     Severe vertigo and dizziness   Azithromycin     Irregular heartbeat   Codeine    Corn Syrup [Glucose]     High Fructose Corn Syrup    Latex    Prevacid [Lansoprazole]     Hair fell out   Thimerosol      Objective:  Physical Exam General: Alert and oriented x3 in no acute distress  Dermatology: The eccrine poroma to the plantar aspect of the fifth MTP right foot is mostly asymptomatic today.  Noted. Skin is warm, dry and supple bilateral lower extremities. Negative for open lesions or macerations.  Vascular: Palpable pedal pulses bilaterally. No edema or erythema noted. Capillary refill within normal limits.  Neurological: Grossly intact via light touch  Musculoskeletal Exam: Unchanged.  Limited range of motion noted the first MTP of the left foot with associated tenderness and crepitus consistent with hallux limitus  and arthritis. Hallux valgus also noted to the right foot  Radiographic exam LT foot 04/18/2024: Advanced degenerative changes with para-articular spurring noted to the first MTP of the left foot.  Assistant with hallux rigidus/osteoarthritis of the joint.  Assessment: 1.  Hallux rigidus/advanced osteoarthritis left; history of cheilectomy surgery several years prior 3.  Hallux valgus right   Plan of Care:  -Patient evaluated.  X-rays reviewed in detail with the patient - Today again we discussed the pathology and etiology of hallux rigidus to the left great toe joint.  Discussed different treatment options both conservative and surgical.  Given the advanced arthritis I do recommend arthrodesis of the great toe joint however currently the patient states that she is able to manage conservatively.  Will continue conservative treatment for now -Recommend topical Voltaren gel PRN -Continue wearing good supportive tennis shoes and sneakers -Return to clinic PRN   Thresa EMERSON Sar, DPM [facility]  Dr. Thresa EMERSON Sar, DPM    [address]  [place], [place] [address]                Office [phone]  Fax [phone]

## 2024-05-12 NOTE — Progress Notes (Signed)
 This encounter was created in error - please disregard.

## 2024-07-19 ENCOUNTER — Other Ambulatory Visit (HOSPITAL_BASED_OUTPATIENT_CLINIC_OR_DEPARTMENT_OTHER): Payer: Self-pay

## 2024-07-19 MED ORDER — COMIRNATY 30 MCG/0.3ML IM SUSY
0.3000 mL | PREFILLED_SYRINGE | Freq: Once | INTRAMUSCULAR | 0 refills | Status: AC
  Filled 2024-07-19: qty 0.3, 1d supply, fill #0

## 2024-09-19 ENCOUNTER — Other Ambulatory Visit: Payer: Self-pay

## 2024-09-19 DIAGNOSIS — Z148 Genetic carrier of other disease: Secondary | ICD-10-CM

## 2024-09-27 ENCOUNTER — Ambulatory Visit (INDEPENDENT_AMBULATORY_CARE_PROVIDER_SITE_OTHER)

## 2024-09-27 DIAGNOSIS — Z148 Genetic carrier of other disease: Secondary | ICD-10-CM

## 2024-09-27 LAB — PULMONARY FUNCTION TEST
DL/VA % pred: 86 %
DL/VA: 3.61 ml/min/mmHg/L
DLCO unc % pred: 90 %
DLCO unc: 16.14 ml/min/mmHg
FEF 25-75 Pre: 2.21 L/s
FEF2575-%Pred-Pre: 142 %
FEV1-%Pred-Pre: 113 %
FEV1-Pre: 2.17 L
FEV1FVC-%Pred-Pre: 106 %
FEV6-%Pred-Pre: 111 %
FEV6-Pre: 2.72 L
FEV6FVC-%Pred-Pre: 104 %
FVC-%Pred-Pre: 106 %
FVC-Pre: 2.73 L
Pre FEV1/FVC ratio: 80 %
Pre FEV6/FVC Ratio: 100 %

## 2024-09-27 NOTE — Progress Notes (Signed)
Spiro/DLCO performed today. 

## 2024-09-27 NOTE — Patient Instructions (Signed)
Spiro/DLCO performed today. 

## 2024-10-27 ENCOUNTER — Ambulatory Visit: Admitting: Internal Medicine

## 2024-10-27 ENCOUNTER — Encounter: Payer: Self-pay | Admitting: Internal Medicine

## 2024-10-27 VITALS — BP 164/84 | HR 72 | Ht 62.0 in | Wt 143.0 lb

## 2024-10-27 DIAGNOSIS — Z7185 Encounter for immunization safety counseling: Secondary | ICD-10-CM

## 2024-10-27 DIAGNOSIS — Z148 Genetic carrier of other disease: Secondary | ICD-10-CM

## 2024-10-27 DIAGNOSIS — R0982 Postnasal drip: Secondary | ICD-10-CM | POA: Diagnosis not present

## 2024-10-27 MED ORDER — AZELASTINE HCL 0.1 % NA SOLN: 2.0000 | Freq: Two times a day (BID) | NASAL | 12 refills | Status: AC

## 2024-10-27 NOTE — Patient Instructions (Addendum)
 Alpha-1-antitrypsin deficiency carrier  - asymptomatic MZ with low levels - PFT stable 2019-> 2026(some variation present) -glad you neer smoked  Plan - shared decision making - do spiro/dlco in 18 months   [facility] counseling  - respect no flu or rsv shot due to thiomerosal allergy  Post nasal drip  Plan Astelin  nasal spray you can try  Followup 18 months but after spiro/dlco

## 2024-10-27 NOTE — Progress Notes (Signed)
 "  OV 07/27/2018  Subjective:  Patient ID: Brianna Carpenter, female , DOB: Oct 02, 1950 , age 74 y.o. , MRN: [id number] , ADDRESS: [address] 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      HPI Brianna Carpenter 74 y.o. -female here for a new consult.  Self-referred.  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  But she has never smoked.  She does not have any biologic kids.  She tells me that approximately 8 years ago while at home she inhaled some bleach which was left open and after the suffered from shortness of breath and chest tightness that slowly work itself out over the next few to several years.  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  There was a spill of this powder.  Apparently this got sucked into her duct system.  And then 2 days later he injected out off the duct system and she started inhaling the chemicals.  Soon after she started noticing shortness of breath, chest tightness and even eye  burned and also nasal irritation.  A day later she figured this was because of the Borax.  She then checked herself into a hotel for 2 days.  She then returned home in the home still continue to smell.  She also had smell and cough.  She got cleaning system and had a house remediated.  She says the house is now almost free of smell.  She is able to live in the house now.  However she is continues to have shortness of breath with exertion and chest tightness but not much of a cough     she does not have any previous history of asthma or smoking history.  She feels her symptoms are improved since it started.  Symptoms are rated as moderate.  There  is no chest pain or diaphoresis.  She saw Dr. Javaid in [place].  She did a pulmonary function test with him on June 28, 2018 but I visualized these results and it is a poor technical quality and uninterpretable.  She had a chest x-ray with him that is clear.  I do not have this image with me.  She also tells me that in general medications do not agree with her because of numerous allergies.  Therefore she wants a very simplistic line of treatment.  Today we did exam nitric oxide  was normal at 26.  We did walking desaturation test on 85 feet x 3 laps on room air: Resting pulse ox 99%.  Resting heart rate 72/min.  Final pulse ox 97% and final heart rate 99/min.  Heart tachycardic but otherwise normal.    Results for Brianna, Carpenter (MRN [id number]) as of 07/27/2018 09:47  Ref. Range 06/20/2009 23:59 02/24/2011 14:26  Eosinophils Absolute Latest Ref Range: 0.0 - 0.7 K/uL 0.0 0.1     09/15/2018  - Visit   74 year old female patient presenting today to review pulmonary function test results.  Patient was suspected reactive airway disease due to chemical exposure 3 months ago.  Patient also had a chemical exposure 8  years ago which caused 6 years of symptoms.  Patient reports that symptoms from 3 months ago have slowly started to resolve and now she is only having episodes of shortness of breath at night.  Patient reports that she feels that things have been going better since last office visit.  Patient reports that symptoms worsened acutely after pulmonary function test for 24 hours but those have since resolved.  Unfortunately patient refused to use albuterol or Xopenex during the pulmonary function test so we do not have a full PFT as originally intended.  09/08/2018-pulmonary function test-FVC 2.88 (98% predicted), ratio 85, FEV1 110, DLCO 92, no postbronchodilator was performed as patient refused to take albuterol or Xopenex  Patient with multiple allergies and patient is resistant to taking  many medications.  Patient reports she does not want to take inhalers as when she took an antibiotic she lost most of her hair and took 6 months to recover from the symptoms.   Tests:  09/08/2018-pulmonary function test-FVC 2.88 (98% predicted), ratio 85, FEV1 110, DLCO 92, no postbronchodilator was performed as patient refused to take albuterol or Xopenex    OV 11/02/2018  Subjective:  Patient ID: Brianna Carpenter, female , DOB: [birth year] , age 74 y.o. , MRN: [id number] , ADDRESS: [address] [place] [address]   11/02/2018 -   Chief Complaint  Patient presents with   Follow-up    Pt states she is doing better since last visit after the exposure to chemicals. States she still has some postnasal drip but denies any complaints of cough, SOB, or chest tightness.     HPI Brianna Carpenter 74 y.o. -returns for follow-up.  At this point in time she says she is completely improved with the shortness of breath.  Denies any shortness of breath cough, chest tightness, weakness, wheezing, orthopnea, proximal nocturnal dyspnea.  The only thing she has is a chronic postnasal drip.  She is more worried about her being an alpha-1 antitrypsin carrier.  This was discovered incidentally in a commercial genetic test.  She is asking about serial monitoring of lung function.  She is not a smoker.  Most recent lung function was normal.  She is asking for local primary care support since her primary care physician is left private practice.  I have recommended [facility] or Dr Roanna of [facility]     OV 12/12/2022  Subjective:  Patient ID: Brianna Carpenter, female , DOB: July 22, 1951 , age 74 y.o. , MRN: [id number] , ADDRESS: [address] [place] [address] PCP Gladystine Erminio CROME, MD Patient Care Team: Gladystine Erminio CROME, MD as PCP - General (Family Medicine)  This Provider for this visit: Treatment Team:  Attending Provider: Geronimo Amel, MD    12/12/2022 -   Chief Complaint  Patient  presents with   Consult    Est patient, seen last 10/2018.  Alpha 1 antitrypsin deficiency.  No sx.  Wanted yearly follow up.  Does have a dry cough due to allergies per patient.     HPI Brianna Carpenter 74 y.o. -essentially seeing her as a new consult because it has been more than 3 years since I last saw her.  She is alpha-1 antitrypsin known MZ (although I am not able to locate the result today in the computer].  She says she is doing well.  No health issues no shortness of breath no cough.  No wheezing.  No smoking.  She wanted to make sure she got  like follow-up.       OV 06/08/2023  Subjective:  Patient ID: Brianna Carpenter, female , DOB: 10-13-1950 , age 32 y.o. , MRN: [id number] , AD[address]ESS: [address] [place] [address] PCP Gladystine Erminio CROME, MD Patient Care Team: Gladystine Erminio CROME, MD as PCP - General (Family Medicine)  This Provider for this visit: Treatment Team:  Attending Provider: Geronimo Amel, MD    06/08/2023 -   Chief Complaint  Patient presents with   Follow-up    Breathing is great. No new co's.      HPI Brianna Carpenter 74 y.o. -alpha-1 MZ  Presents for follow-up.  Last visit we checked alpha-1 MZ and the level was 77 mg/dL.  She states many years ago it was in the 40s.  I did indicate to her that both levels are low and did suggest that if she were smoker she would have definitely develop COPD.  Fortunately her pulmonary function test continues to be normal.  Her current pulmonary function test is also normal.  She had an abnormal one in May but that was because of the fact the machine was broken.  Some of this got resulted.  But the current July er 2024 1 is the same as 2019 and there is no change in pulmonary function.  She walks daily and she has improvement in blood pressure.  She cannot have the flu shot of the RSV shot because of thimerosal allergy.  But she will going to have the COVID shot and the pneumonia shot.  We discussed family  history of alpha-1 she does not have any biologic kids.  She has 2 sisters and they have children.  She says none of the biological family wants to get alpha-1 tested.  I did print and give her the result so she can continue counseling the family.       OV 10/27/2024  Subjective:  Patient ID: Brianna Carpenter, female , DOB: Jul 17, 1951 , age 15 y.o. , MRN: [id number] , AD[address]ESS: [address] [place] [address] PCP Gladystine Erminio CROME, MD Patient Care Team: Gladystine Erminio CROME, MD as PCP - General (Family Medicine)  This Provider for this visit: Treatment Team:  Attending Provider: Geronimo Amel, MD    10/27/2024 -   Chief Complaint  Patient presents with   Medical Management of Chronic Issues   Alpha-1 antitrypsin deficiency carrier    Review PFT. Breathing is doing well.      HPI Brianna Carpenter 74 y.o. -mily A Outten is a 74 year old female with alpha-1 antitrypsin deficiency who presents for follow-up of lung function. NOn smoker. Likes tto get lung function monitorie  She experiences a mild dry cough during winter mornings, which she attributes to postnasal drip. She avoids medication for this due to concerns about her liver and her alpha-1 antitrypsin deficiency. No shortness of breath, wheezing, or chest pain. She has reduced her nighttime medication, specifically decreasing her use of lorazepam. She has not had any hospitalizations, emergency room visits, or surgeries since her last visit a year and a half ago. She has not developed any new medical problems and is pleased with her current health status.  She has not received a flu shot due to an egg sensitivity but has received pneumonia and COVID-19 vaccinations.  She mentions having pancreatic problems and notes that antihistamines seem to aggravate this condition. She has not tried nasal antihistamines.     SYMPTOM SCALE -  06/08/2023  Current weight   O2 use ra  Shortness of Breath 0 -> 5 scale with 5 being  worst (score 6 If unable to do)  At rest 0  Simple tasks - showers, clothes change, eating, shaving 0  Household (dishes, doing bed, laundry) 0  Shopping 0  Walking level at own pace 0  Walking up Stairs 1  Total (30-36) Dyspnea Score 0  How bad is your cough? 00  How bad is your fatigue 0  How bad is nausea 0  How bad is vomiting?  0  How bad is diarrhea? 0  How bad is anxiety? 0  How bad is depression 0  Any chronic pain - if so where and how bad 0      PFT     Latest Ref Rng & Units 09/27/2024    9:37 AM 04/16/2023    2:48 PM 01/29/2023    9:55 AM 09/08/2018    8:51 AM  PFT Results  FVC-Pre L 2.73  2.63  24.61  2.88   FVC-Predicted Pre % 106  99  929  98   FVC-Post L  2.64     FVC-Predicted Post %  99     Pre FEV1/FVC % % 80  83  14  85   Post FEV1/FCV % %  85     FEV1-Pre L 2.17  2.20  3.46  2.44   FEV1-Predicted Pre % 113  110  173  110   FEV1-Post L  2.24     DLCO uncorrected ml/min/mmHg 16.14  17.26   21.09   DLCO UNC% % 90  96   92   DLCO corrected ml/min/mmHg  17.26     DLCO COR %Predicted %  96     DLVA Predicted % 86  93   108   TLC L  6.49   5.31   TLC % Predicted %  136   108   RV % Predicted %  175   118        LAB RESULTS last 96 hours No results found.       has a past medical history of Alpha-1-antitrypsin deficiency (HCC), Arthritis, Chronic neck pain, Heart murmur (2018), Hypertension, Hypertensive retinopathy, Insomnia, Kidney stone (2010), Plantar fasciitis, TMJ (temporomandibular joint syndrome), and Vertigo.   reports that she has never smoked. She has never used smokeless tobacco.  Past Surgical History:  Procedure Laterality Date   ABDOMINAL HYSTERECTOMY  1990   ADENOIDECTOMY     BACK SURGERY     eye correction     KIDNEY STONE SURGERY     left foot surgery     PARTIAL HYSTERECTOMY     TONSILLECTOMY      Allergies[1]  Immunization History  Administered Date(s) Administered   PFIZER(Purple Top)SARS-COV-2 Vaccination  10/22/2019, 11/12/2019, 07/03/2020   PNEUMOCOCCAL CONJUGATE-20 09/05/2024   Pfizer Covid-19 Vaccine Bivalent Booster 26yrs & up 06/26/2021   Pfizer(Comirnaty )Fall Seasonal Vaccine 12 years and older 07/29/2022, 07/01/2023, 07/19/2024   Pneumococcal Conjugate-13 05/07/2015   Pneumococcal Polysaccharide-23 06/15/2017   Pneumococcal-Unspecified 06/15/2017   Polio, Unspecified 12/12/1954, 01/17/1955, 10/07/1955   Td 03/29/2012, 01/03/2020    Family History  Problem Relation Age of Onset   Heart attack Mother    Heart disease Father    Colon cancer Neg Hx    Esophageal cancer Neg Hx    Stomach cancer Neg Hx     Current Medications[2]      Objective:   Vitals:  10/27/24 1528  BP: (!) 164/84  Pulse: 72  SpO2: 97%  Weight: 143 lb (64.9 kg)  Height: 5' 2 (1.575 m)    Estimated body mass index is 26.16 kg/m as calculated from the following:   Height as of this encounter: 5' 2 (1.575 m).   Weight as of this encounter: 143 lb (64.9 kg).  @WEIGHTCHANGE @  Filed Weights   10/27/24 1528  Weight: 143 lb (64.9 kg)     Physical Exam   General: No distress. Looks well O2 at rest: [employer] present: no Sitting in wheel chair: no Frail: no Obese: no Neuro: Alert and Oriented x 3. GCS 15. Speech normal Psych: Pleasant Resp:  Barrel Chest - no.  Wheeze - no, Crackles - no, No overt respiratory distress CVS: Normal heart sounds. Murmurs - no Ext: Stigmata of Connective Tissue Disease - no HEENT: Normal upper airway. PEERL +. No post nasal drip        Assessment/     Assessment & Plan Post-nasal drip  Alpha-1-antitrypsin deficiency carrier  Vaccine counseling    PLAN Patient Instructions  Alpha-1-antitrypsin deficiency carrier  - asymptomatic MZ with low levels - PFT stable 2019-> 2026(some variation present) -glad you neer smoked  Plan - shared decision making - do spiro/dlco in 18 months   VAccine counseling  - respect no flu or rsv shot due to  thiomerosal allergy  Post nasal drip  Plan Astelin  nasal spray you can try  Followup 18 months but after spiro/dlco    FOLLOWUP    Return for 18 months but after spiro/dlco.    SIGNATURE    Dr. Dorethia Cave, M.D., F.C.C.P,  Pulmonary and Critical Care Medicine Staff Physician, [facility] Director - Interstitial Lung Disease  Program  Pulmonary Fibrosis [facility] at [facility] [place], [place], [address]  Pager: [phone], If no answer or between  15:00h - 7:00h: call [phone] Telephone: [phone]  4:35 PM 10/27/2024     [1]  Allergies Allergen Reactions   Fentanyl Anaphylaxis   Augmentin [Amoxicillin-Pot Clavulanate]     Severe vertigo and dizziness   Azithromycin     Irregular heartbeat   Codeine    Corn Syrup [Glucose]     High Fructose Corn Syrup    Latex    Prevacid [Lansoprazole]     Hair fell out   Thimerosol   [2]  Current Outpatient Medications:    Aloe LIQD, Take by mouth as needed., Disp: , Rfl:    AMBULATORY NON FORMULARY MEDICATION, CBD cream Use cream topical three times weekly, Disp: , Rfl:    azelastine  (ASTELIN ) 0.1 % nasal spray, Place 2 sprays into both nostrils 2 (two) times daily. Use in each nostril as directed, Disp: 30 mL, Rfl: 12   estradiol (ESTRACE) 0.1 MG/GM vaginal cream, , Disp: , Rfl:    LORazepam (ATIVAN) 0.5 MG tablet, Take 0.5 mg by mouth at bedtime., Disp: , Rfl:   "

## 2026-04-26 ENCOUNTER — Encounter
